# Patient Record
Sex: Female | Born: 1940 | Race: Black or African American | Hispanic: No | Marital: Married | Smoking: Never smoker
Health system: Southern US, Community
[De-identification: ages and names within clinical notes are randomized; demographics above are authoritative.]

## PROBLEM LIST (undated history)

## (undated) DIAGNOSIS — F419 Anxiety disorder, unspecified: Secondary | ICD-10-CM

## (undated) DIAGNOSIS — I809 Phlebitis and thrombophlebitis of unspecified site: Secondary | ICD-10-CM

## (undated) DIAGNOSIS — C801 Malignant (primary) neoplasm, unspecified: Secondary | ICD-10-CM

## (undated) DIAGNOSIS — F329 Major depressive disorder, single episode, unspecified: Secondary | ICD-10-CM

## (undated) DIAGNOSIS — R609 Edema, unspecified: Secondary | ICD-10-CM

## (undated) DIAGNOSIS — E039 Hypothyroidism, unspecified: Secondary | ICD-10-CM

## (undated) DIAGNOSIS — I10 Essential (primary) hypertension: Secondary | ICD-10-CM

## (undated) DIAGNOSIS — F32A Depression, unspecified: Secondary | ICD-10-CM

## (undated) HISTORY — PX: THYROIDECTOMY: SHX17

## (undated) HISTORY — PX: MASTECTOMY: SHX3

## (undated) HISTORY — PX: ABDOMINAL HYSTERECTOMY: SHX81

---

## 2007-03-21 ENCOUNTER — Ambulatory Visit: Payer: Self-pay | Admitting: Gastroenterology

## 2012-02-12 ENCOUNTER — Ambulatory Visit: Payer: Self-pay | Admitting: Oncology

## 2012-02-29 ENCOUNTER — Ambulatory Visit: Payer: Self-pay | Admitting: Oncology

## 2012-03-11 ENCOUNTER — Encounter: Payer: Self-pay | Admitting: Oncology

## 2012-03-31 ENCOUNTER — Encounter: Payer: Self-pay | Admitting: Oncology

## 2012-04-30 ENCOUNTER — Encounter: Payer: Self-pay | Admitting: Oncology

## 2012-05-31 ENCOUNTER — Encounter: Payer: Self-pay | Admitting: Oncology

## 2013-09-04 ENCOUNTER — Ambulatory Visit: Payer: Self-pay | Admitting: Gastroenterology

## 2013-09-24 IMAGING — US US EXTREM UP VENOUS*L*
1 series · 14 of 24 positions shown · non-contrast
Comparison: none

REASON FOR EXAM: swelling left arm
COMMENTS:

PROCEDURE:     US  - US DOPPLER UP EXTR LEFT  - February 19, 2012 [DATE]
RESULT:

[Series 1: us extrem up venous*left* · 0.08mm/px · 14 of 46 slices shown]
[im 1/46]
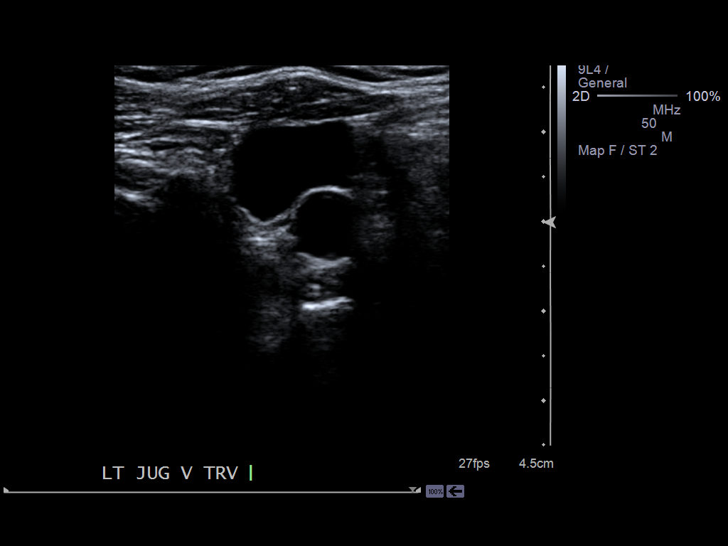
[im 4/46]
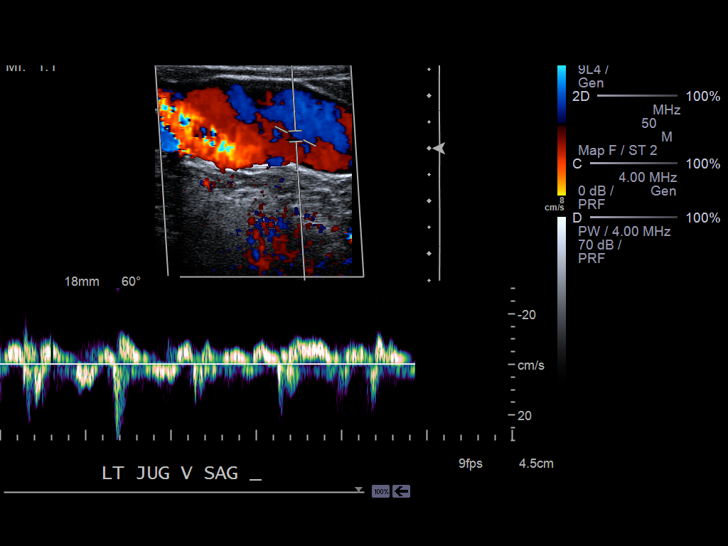
[im 8/46]
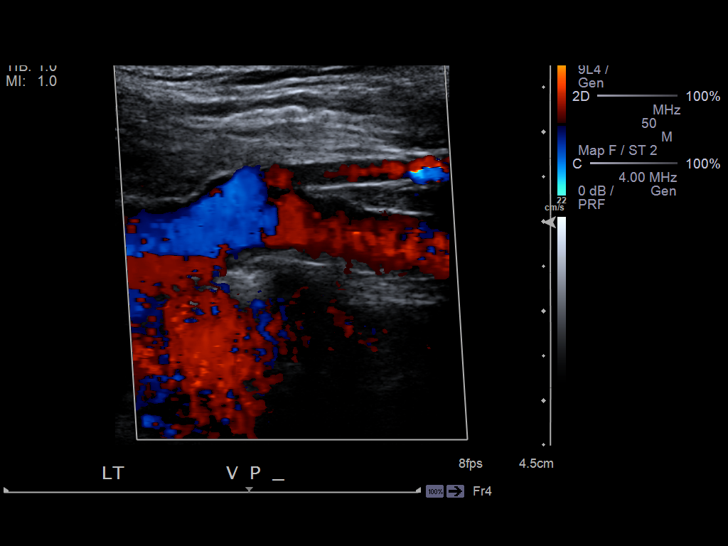
[im 12/46]
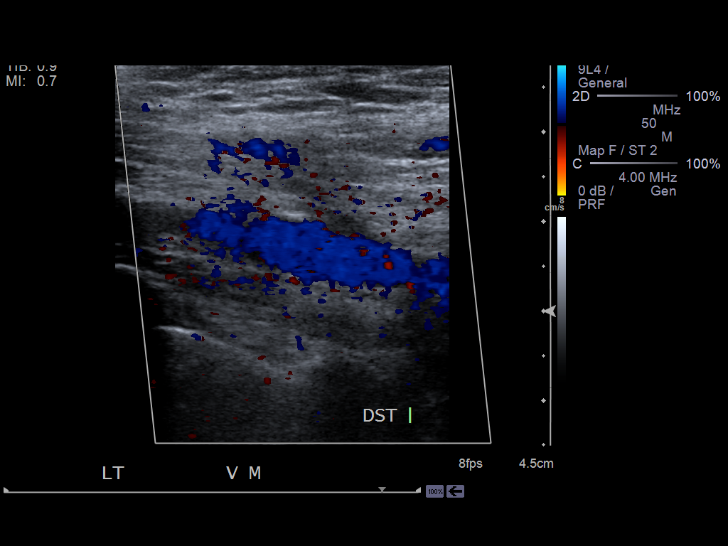
[im 14/46]
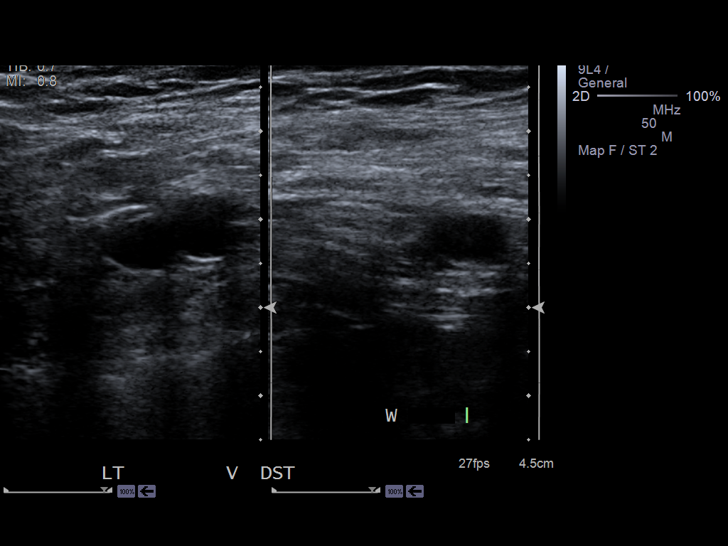
[im 18/46]
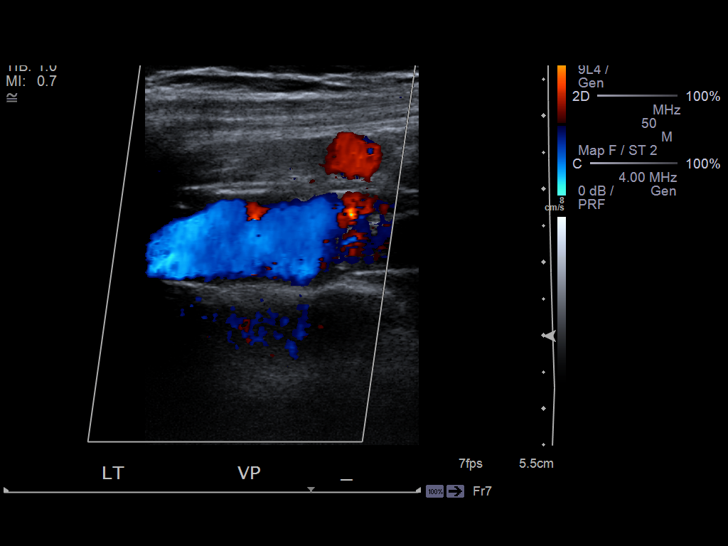
[im 22/46]
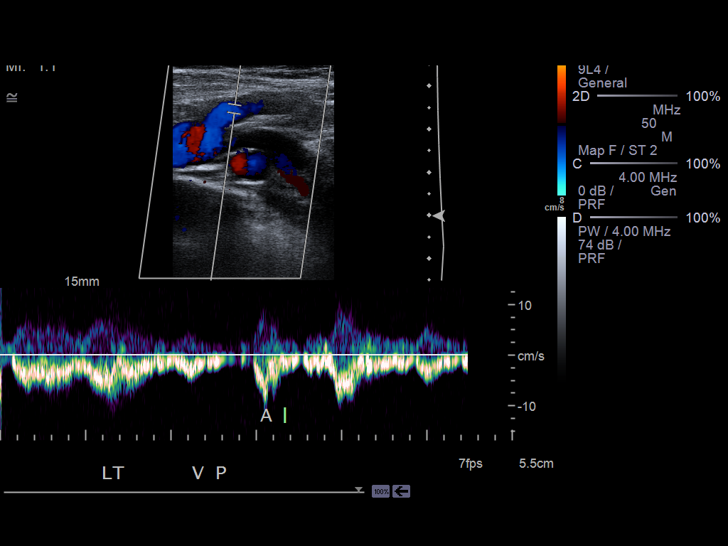
[im 24/46]
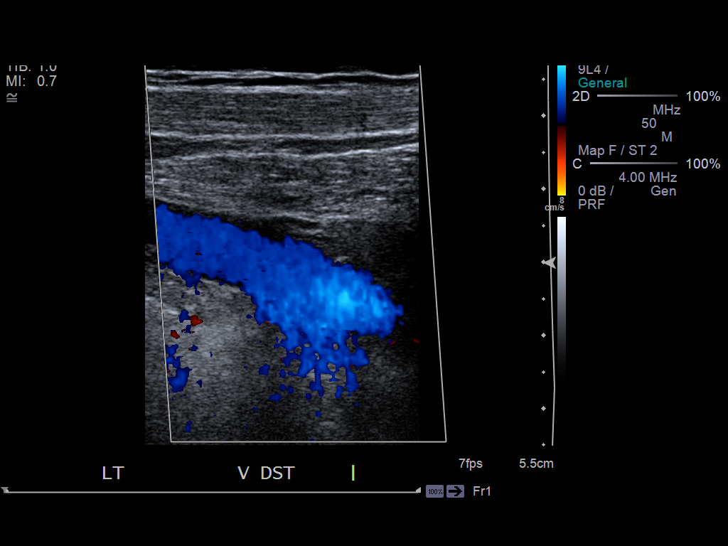
[im 28/46]
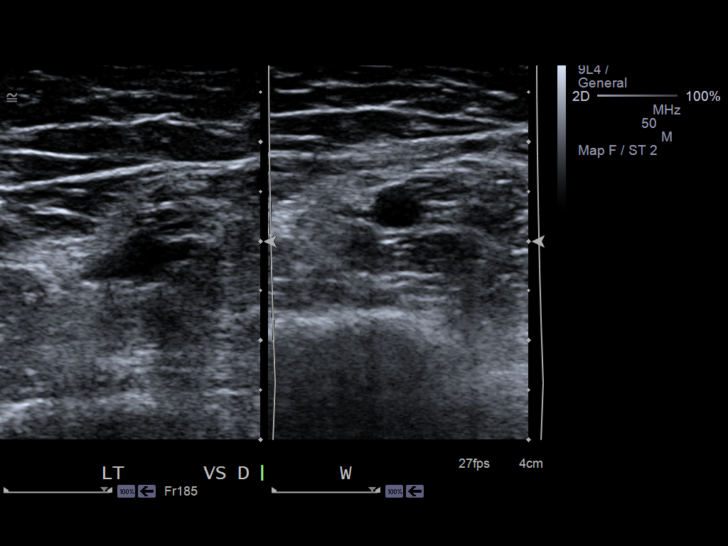
[im 32/46]
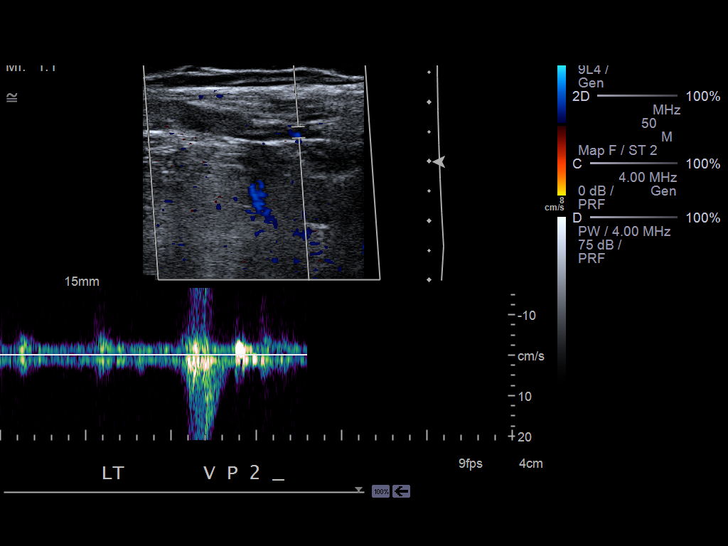
[im 36/46]
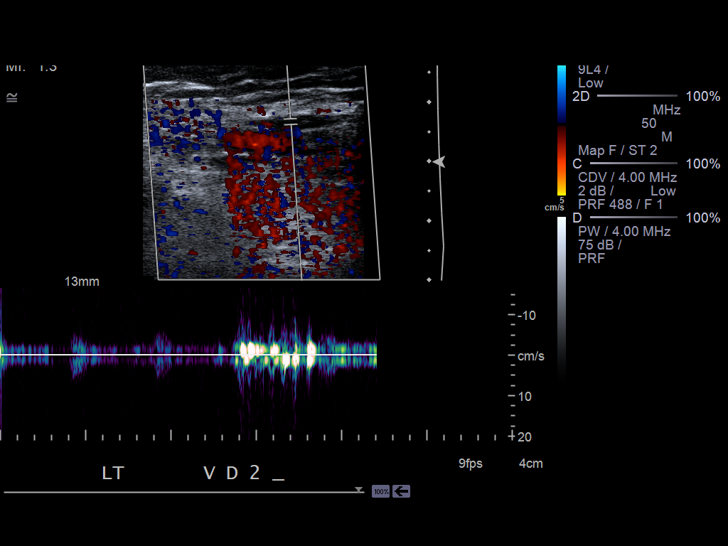
[im 38/46]
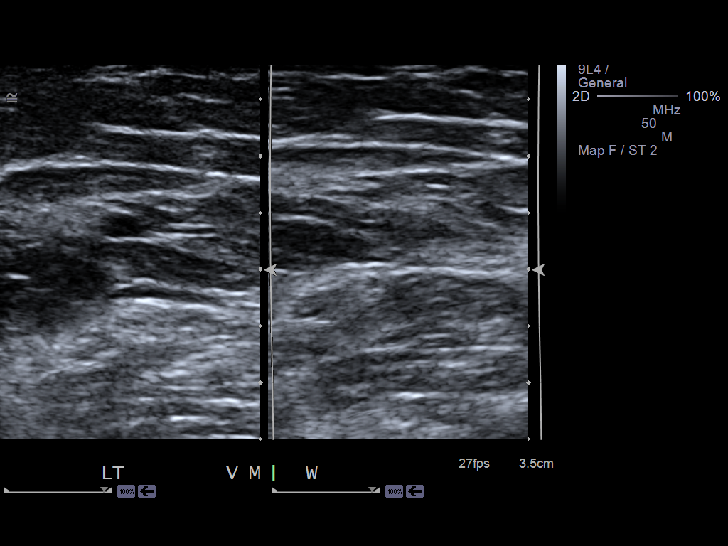
[im 42/46]
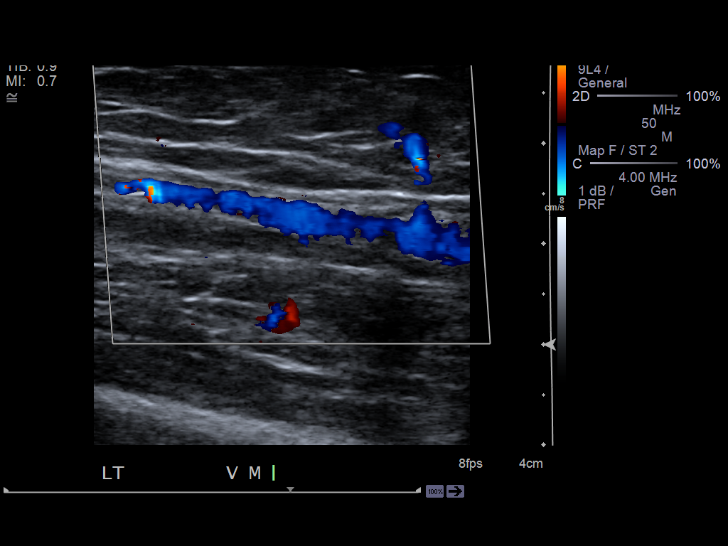
[im 46/46]
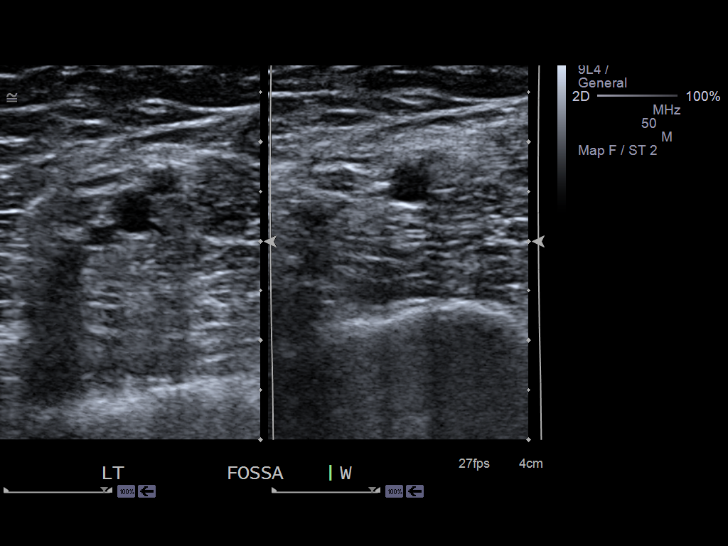

[14 of 24 positions shown; findings below may reference images not displayed]

FINDINGS: Grayscale, duplex Doppler, color flow and SPECTRAL waveform
imaging was performed of the deep venous structures of the left upper
extremity.

Interrogation of the venous structures demonstrates no evidence of abnormal
compressibility or regions of increased echogenicity. Appropriate color
filling is identified within the vessels as well as appropriate SPECTRAL
waveform imaging.
IMPRESSION: No sonographic evidence of deep venous thrombus in the interrogated
structures of the left upper extremity.

## 2014-11-17 NOTE — Consult Note (Signed)
Note Type Consult   HPI: Referred by Elin Claggett, PA-C.   This 74 year old Female patient presents to the clinic for initial evaluation of (1)Left arm swelling.  Subjective:  Chief Complaint/Diagnosis:   left arm swelling.  HPI:   Patient is a 74 year old female who had a left mastectomy in 1991 for breast cancer who recently had left arm swelling but was not improving.  She also has a history of a right mastectomy in the late 70s.  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  Currently, other than her left arm swelling she feels well.  She has no neurologic complaints.  She denies any fevers or illnesses.  She has good appetite and denies weight loss.  She denies any pain.  She has no chest pain or shortness of breath.  She denies any nausea, vomiting, constipation, or diarrhea.  She has no urinary complaints.  Patient feels at her baseline and offers no further specific complaints today.   Review of Systems:   Performance Status (ECOG): 0   Pain ?: No complaints (0, none)   Emotional well-being: None   Review of Systems:   As per HPI. Otherwise, 10 point system review was negative.   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    Family history: Hypertension, hypercholesteremia.    Social history: Patient denies tobacco or alcohol.   Home Medications: Medication Instructions Last Modified Date/Time  levothyroxine 75 mcg (0.075 mg) oral tablet 1 tab(s) orally once a day 22-Jul-13 10:49  sertraline 100 mg oral tablet 1 tab(s) orally 2 times a day 22-Jul-13 10:49  ergocalciferol 50,000 intl units (1.25 mg) oral capsule cap(s) orally  22-Jul-13 10:49  pravastatin 80 mg oral tablet 1 tab(s) orally once a day (at bedtime) 22-Jul-13 10:49  aspirin 81 mg oral  tablet 1 tab(s) orally once a day 22-Jul-13 10:49  hydrochlorothiazide-losartan 25 mg-100 mg oral tablet 1 tab(s) orally once a day 22-Jul-13 10:49  multivitamin 1  orally once a day 22-Jul-13 10:49   Vital Signs:   :: Ht(CM): 170 Wt(KG): 71.2 BSA: 1.8 Temp: 98.4 Pulse: 72 RR: 20  BP: 146/75   Physical Exam:   General: well developed, well nourished, and in no acute distress   Mental Status: normal affect   Eyes: anicteric sclera   Head, Ears, Nose,Throat: Normocephalic, moist mucous membranes, clear oropharynx without erythema or thrush.   Neck, Thyroid: No palpable lymphadenopathy, thyroid midline without nodules.   Respiratory: clear to auscultation bilaterally   Cardiovascular: regular rate and rhythm, no murmur, rub, or gallop   Gastrointestinal: soft, nondistended, nontender, no organomegaly.  normal active bowel sounds   Musculoskeletal: Mild edema of the left distal upper extremity.   Skin: No rash or petechiae noted   Neurological: alert, answering all questions appropriately.  Cranial nerves grossly intact   Assessment and Plan:  Impression:   Left upper extremity swelling  Plan:   1.  Left arm swelling:  Unclear etiology.  It would be unusual to develop lymphedema greater than 12 years removed from her surgery, but given patient's recent increase in activity it is possible.  Ultrasound Doppler today of her arm was negative for DVT or any other abnormality.  Patient was given a referral to lymphedema clinic for further evaluation and treatment.  No followup is necessary in  the [place].  Patient expressed understanding and was in agreement with this plan.  CC Referral:   cc: Astronomer, PA-C   Electronic Signatures: Delight Hoh (MD)  (Signed 22-Jul-13 12:56)  Authored: Note Type, History of Present Illness, CC/HPI, Review of Systems, ALLERGIES, Patient Family Social History, HOME MEDICATIONS, Vital Signs, Physical Exam, Assessment and Plan, CC Referring  Physician   Last Updated: 22-Jul-13 12:56 by Delight Hoh (MD)  References: 1.  Data Referenced From "[facility] Office Nurse Note" 19-Feb-2012 10:39 AM

## 2016-07-17 ENCOUNTER — Other Ambulatory Visit (HOSPITAL_COMMUNITY): Payer: Self-pay | Admitting: Nephrology

## 2016-07-17 DIAGNOSIS — N183 Chronic kidney disease, stage 3 unspecified: Secondary | ICD-10-CM

## 2016-07-20 ENCOUNTER — Ambulatory Visit (HOSPITAL_COMMUNITY): Payer: Self-pay

## 2016-07-27 ENCOUNTER — Ambulatory Visit (HOSPITAL_COMMUNITY)
Admission: RE | Admit: 2016-07-27 | Discharge: 2016-07-27 | Disposition: A | Discharge status: Home / Self Care | Payer: Medicare Other | Source: Ambulatory Visit | Attending: Nephrology | Admitting: Nephrology

## 2016-07-27 DIAGNOSIS — N183 Chronic kidney disease, stage 3 unspecified: Secondary | ICD-10-CM

## 2016-07-27 DIAGNOSIS — R9341 Abnormal radiologic findings on diagnostic imaging of renal pelvis, ureter, or bladder: Secondary | ICD-10-CM | POA: Diagnosis not present

## 2017-03-28 ENCOUNTER — Encounter: Payer: Self-pay | Admitting: *Deleted

## 2017-04-03 NOTE — H&P (Signed)
See scanned not.

## 2017-04-04 ENCOUNTER — Ambulatory Visit: Payer: Medicare Other | Admitting: Anesthesiology

## 2017-04-04 ENCOUNTER — Encounter: Payer: Self-pay | Admitting: Anesthesiology

## 2017-04-04 ENCOUNTER — Ambulatory Visit
Admission: RE | Admit: 2017-04-04 | Discharge: 2017-04-04 | Disposition: A | Discharge status: Home / Self Care | Payer: Medicare Other | Source: Ambulatory Visit | Attending: Ophthalmology | Admitting: Ophthalmology

## 2017-04-04 ENCOUNTER — Encounter
Admission: RE | Disposition: A | Discharge status: Home / Self Care | Payer: Self-pay | Source: Ambulatory Visit | Attending: Ophthalmology

## 2017-04-04 DIAGNOSIS — I10 Essential (primary) hypertension: Secondary | ICD-10-CM | POA: Diagnosis not present

## 2017-04-04 DIAGNOSIS — H2512 Age-related nuclear cataract, left eye: Secondary | ICD-10-CM | POA: Diagnosis present

## 2017-04-04 DIAGNOSIS — Z79899 Other long term (current) drug therapy: Secondary | ICD-10-CM | POA: Insufficient documentation

## 2017-04-04 DIAGNOSIS — E039 Hypothyroidism, unspecified: Secondary | ICD-10-CM | POA: Insufficient documentation

## 2017-04-04 HISTORY — DX: Major depressive disorder, single episode, unspecified: F32.9

## 2017-04-04 HISTORY — DX: Phlebitis and thrombophlebitis of unspecified site: I80.9

## 2017-04-04 HISTORY — DX: Malignant (primary) neoplasm, unspecified: C80.1

## 2017-04-04 HISTORY — DX: Depression, unspecified: F32.A

## 2017-04-04 HISTORY — DX: Edema, unspecified: R60.9

## 2017-04-04 HISTORY — DX: Anxiety disorder, unspecified: F41.9

## 2017-04-04 HISTORY — PX: CATARACT EXTRACTION W/PHACO: SHX586

## 2017-04-04 HISTORY — DX: Hypothyroidism, unspecified: E03.9

## 2017-04-04 HISTORY — DX: Essential (primary) hypertension: I10

## 2017-04-04 SURGERY — PHACOEMULSIFICATION, CATARACT, WITH IOL INSERTION
Anesthesia: Monitor Anesthesia Care | Site: Eye | Laterality: Left | Wound class: Clean

## 2017-04-04 MED ORDER — LIDOCAINE HCL (PF) 4 % IJ SOLN
INTRAMUSCULAR | Status: DC | PRN
  Administered 2017-04-04: 4 mL via OPHTHALMIC

## 2017-04-04 MED ORDER — CARBACHOL 0.01 % IO SOLN
INTRAOCULAR | Status: DC | PRN
  Administered 2017-04-04: .5 mL via INTRAOCULAR

## 2017-04-04 MED ORDER — NA CHONDROIT SULF-NA HYALURON 40-17 MG/ML IO SOLN
INTRAOCULAR | Status: DC | PRN
  Administered 2017-04-04: 1 mL via INTRAOCULAR

## 2017-04-04 MED ORDER — HYALURONIDASE HUMAN 150 UNIT/ML IJ SOLN
INTRAMUSCULAR | Status: AC
  Filled 2017-04-04: qty 1

## 2017-04-04 MED ORDER — PHENYLEPHRINE HCL 10 % OP SOLN
OPHTHALMIC | Status: AC
  Filled 2017-04-04: qty 5

## 2017-04-04 MED ORDER — MIDAZOLAM HCL 2 MG/2ML IJ SOLN
INTRAMUSCULAR | Status: DC | PRN
  Administered 2017-04-04: 1 mg via INTRAVENOUS

## 2017-04-04 MED ORDER — LIDOCAINE HCL (PF) 4 % IJ SOLN
INTRAOCULAR | Status: DC | PRN
  Administered 2017-04-04: 2 mL via OPHTHALMIC

## 2017-04-04 MED ORDER — CEFUROXIME OPHTHALMIC INJECTION 1 MG/0.1 ML
INJECTION | OPHTHALMIC | Status: DC | PRN
  Administered 2017-04-04: 1 mL via INTRACAMERAL

## 2017-04-04 MED ORDER — BUPIVACAINE HCL (PF) 0.75 % IJ SOLN
INTRAMUSCULAR | Status: AC
  Filled 2017-04-04: qty 10

## 2017-04-04 MED ORDER — POVIDONE-IODINE 5 % OP SOLN
OPHTHALMIC | Status: DC | PRN
  Administered 2017-04-04: 1 via OPHTHALMIC

## 2017-04-04 MED ORDER — ALFENTANIL 500 MCG/ML IJ INJ
INJECTION | INTRAVENOUS | Status: DC | PRN
  Administered 2017-04-04 (×2): 100 ug via INTRAVENOUS
  Administered 2017-04-04: 300 ug via INTRAVENOUS

## 2017-04-04 MED ORDER — POVIDONE-IODINE 5 % OP SOLN
OPHTHALMIC | Status: AC
  Filled 2017-04-04: qty 30

## 2017-04-04 MED ORDER — MOXIFLOXACIN HCL 0.5 % OP SOLN: 1.0000 [drp] | OPHTHALMIC | Status: DC

## 2017-04-04 MED ORDER — MIDAZOLAM HCL 2 MG/2ML IJ SOLN: INTRAMUSCULAR | Status: DC | PRN

## 2017-04-04 MED ORDER — EPINEPHRINE PF 1 MG/ML IJ SOLN
INTRAOCULAR | Status: DC | PRN
  Administered 2017-04-04: 1 mL via OPHTHALMIC

## 2017-04-04 MED ORDER — PROPOFOL 10 MG/ML IV BOLUS
INTRAVENOUS | Status: AC
  Filled 2017-04-04: qty 20

## 2017-04-04 MED ORDER — TETRACAINE HCL 0.5 % OP SOLN
OPHTHALMIC | Status: DC | PRN
  Administered 2017-04-04: 1 [drp] via OPHTHALMIC

## 2017-04-04 MED ORDER — CYCLOPENTOLATE HCL 2 % OP SOLN
OPHTHALMIC | Status: AC
  Filled 2017-04-04: qty 2

## 2017-04-04 MED ORDER — LIDOCAINE HCL (PF) 4 % IJ SOLN
INTRAMUSCULAR | Status: AC
  Filled 2017-04-04: qty 5

## 2017-04-04 MED ORDER — CYCLOPENTOLATE HCL 2 % OP SOLN
1.0000 [drp] | OPHTHALMIC | Status: AC
  Administered 2017-04-04 (×4): 1 [drp] via OPHTHALMIC

## 2017-04-04 MED ORDER — PHENYLEPHRINE HCL 10 % OP SOLN
1.0000 [drp] | OPHTHALMIC | Status: AC
  Administered 2017-04-04 (×4): 1 [drp] via OPHTHALMIC

## 2017-04-04 MED ORDER — MOXIFLOXACIN HCL 0.5 % OP SOLN
1.0000 [drp] | OPHTHALMIC | Status: AC | PRN
Start: 2017-04-04 — End: 2017-04-04
  Administered 2017-04-04 (×3): 1 [drp] via OPHTHALMIC

## 2017-04-04 MED ORDER — NA CHONDROIT SULF-NA HYALURON 40-17 MG/ML IO SOLN
INTRAOCULAR | Status: AC
  Filled 2017-04-04: qty 1

## 2017-04-04 MED ORDER — SODIUM CHLORIDE 0.9 % IV SOLN
INTRAVENOUS | Status: DC
  Administered 2017-04-04: 07:00:00 via INTRAVENOUS

## 2017-04-04 MED ORDER — MOXIFLOXACIN HCL 0.5 % OP SOLN
OPHTHALMIC | Status: DC | PRN
  Administered 2017-04-04: 1 [drp] via OPHTHALMIC

## 2017-04-04 MED ORDER — MOXIFLOXACIN HCL 0.5 % OP SOLN
OPHTHALMIC | Status: AC
  Administered 2017-04-04: 07:00:00
  Filled 2017-04-04: qty 3

## 2017-04-04 MED ORDER — CEFUROXIME OPHTHALMIC INJECTION 1 MG/0.1 ML
INJECTION | OPHTHALMIC | Status: AC
  Filled 2017-04-04: qty 0.1

## 2017-04-04 MED ORDER — MIDAZOLAM HCL 2 MG/2ML IJ SOLN
INTRAMUSCULAR | Status: AC
  Filled 2017-04-04: qty 2

## 2017-04-04 MED ORDER — EPINEPHRINE PF 1 MG/ML IJ SOLN
INTRAMUSCULAR | Status: AC
  Filled 2017-04-04: qty 1

## 2017-04-04 SURGICAL SUPPLY — 24 items
CORD BIP STRL DISP 12FT (MISCELLANEOUS) ×3 IMPLANT
DRAPE XRAY CASSETTE 23X24 (DRAPES) ×3 IMPLANT
ERASER HMR WETFIELD 18G (MISCELLANEOUS) ×3 IMPLANT
GLOVE BIO SURGEON STRL SZ8 (GLOVE) ×3 IMPLANT
GLOVE SURG LX 6.5 MICRO (GLOVE) ×2
GLOVE SURG LX 8.0 MICRO (GLOVE) ×2
GLOVE SURG LX STRL 6.5 MICRO (GLOVE) ×1 IMPLANT
GLOVE SURG LX STRL 8.0 MICRO (GLOVE) ×1 IMPLANT
GOWN STRL REUS W/ TWL LRG LVL3 (GOWN DISPOSABLE) ×1 IMPLANT
GOWN STRL REUS W/ TWL XL LVL3 (GOWN DISPOSABLE) ×1 IMPLANT
GOWN STRL REUS W/TWL LRG LVL3 (GOWN DISPOSABLE) ×2
GOWN STRL REUS W/TWL XL LVL3 (GOWN DISPOSABLE) ×2
LABEL CATARACT MEDS ST (LABEL) ×3 IMPLANT
LENS IOL ACRYSOF IQ 20.5 (Intraocular Lens) ×3 IMPLANT
PACK CATARACT (MISCELLANEOUS) ×3 IMPLANT
PACK CATARACT DINGLEDEIN LX (MISCELLANEOUS) ×3 IMPLANT
PACK EYE AFTER SURG (MISCELLANEOUS) ×3 IMPLANT
SHLD EYE VISITEC  UNIV (MISCELLANEOUS) ×3 IMPLANT
SOL BSS BAG (MISCELLANEOUS) ×3
SOLUTION BSS BAG (MISCELLANEOUS) ×1 IMPLANT
SUT SILK 5-0 (SUTURE) ×3 IMPLANT
SYR 5ML LL (SYRINGE) ×3 IMPLANT
WATER STERILE IRR 250ML POUR (IV SOLUTION) ×3 IMPLANT
WIPE NON LINTING 3.25X3.25 (MISCELLANEOUS) ×3 IMPLANT

## 2017-04-04 NOTE — Anesthesia Preprocedure Evaluation (Signed)
Anesthesia Evaluation  Patient identified by MRN, date of birth, ID band Patient awake    Reviewed: Allergy & Precautions, NPO status , Patient's Chart, lab work & pertinent test results, reviewed documented beta blocker date and time   Airway Mallampati: II  TM Distance: >3 FB     Dental  (+) Chipped   Pulmonary           Cardiovascular hypertension, Pt. on medications      Neuro/Psych PSYCHIATRIC DISORDERS Anxiety Depression    GI/Hepatic   Endo/Other  Hypothyroidism   Renal/GU      Musculoskeletal   Abdominal   Peds  Hematology   Anesthesia Other Findings   Reproductive/Obstetrics                             Anesthesia Physical Anesthesia Plan  ASA: III  Anesthesia Plan: MAC   Post-op Pain Management:    Induction:   PONV Risk Score and Plan:   Airway Management Planned:   Additional Equipment:   Intra-op Plan:   Post-operative Plan:   Informed Consent: I have reviewed the patients History and Physical, chart, labs and discussed the procedure including the risks, benefits and alternatives for the proposed anesthesia with the patient or authorized representative who has indicated his/her understanding and acceptance.     Plan Discussed with: CRNA  Anesthesia Plan Comments:         Anesthesia Quick Evaluation

## 2017-04-04 NOTE — Anesthesia Postprocedure Evaluation (Signed)
Anesthesia Post Note  Patient: Brianna Keller  Procedure(s) Performed: Procedure(s) (LRB): CATARACT EXTRACTION PHACO AND INTRAOCULAR LENS PLACEMENT ([facility]) (Left)  Patient location during evaluation: PACU Anesthesia Type: MAC Level of consciousness: awake and alert Pain management: pain level controlled Vital Signs Assessment: post-procedure vital signs reviewed and stable Respiratory status: spontaneous breathing, nonlabored ventilation, respiratory function stable and patient connected to nasal cannula oxygen Cardiovascular status: stable and blood pressure returned to baseline Anesthetic complications: no     Last Vitals:  Vitals:   04/04/17 0825 04/04/17 0840  BP: (!) 158/85 (!) 176/67  Pulse: (!) 57 (!) 58  Resp: 16   Temp: (!) 36 C   SpO2: 100% 96%    Last Pain:  Vitals:   04/04/17 0825  TempSrc: Temporal                 Amarisa Wilinski S

## 2017-04-04 NOTE — Anesthesia Post-op Follow-up Note (Signed)
Anesthesia QCDR form completed.        

## 2017-04-04 NOTE — Transfer of Care (Signed)
Immediate Anesthesia Transfer of Care Note  Patient: Brianna Keller  Procedure(s) Performed: Procedure(s) with comments: CATARACT EXTRACTION PHACO AND INTRAOCULAR LENS PLACEMENT ([place]) (Left) - [place] 02:16 AP% 26.5 CDE 55.43 Fluid pack lot # [id number]  Patient Location: PACU  Anesthesia Type:MAC  Level of Consciousness: awake, alert  and oriented  Airway & Oxygen Therapy: Patient Spontanous Breathing  Post-op Assessment: Report given to RN and Post -op Vital signs reviewed and stable  Post vital signs: Reviewed and stable  Last Vitals:  Vitals:   04/04/17 0633  BP: (!) 197/58  Pulse: 65  Resp: 18  Temp: 36.5 C  SpO2: 100%    Last Pain:  Vitals:   04/04/17 0633  TempSrc: Tympanic         Complications: No apparent anesthesia complications

## 2017-04-04 NOTE — Discharge Instructions (Addendum)
Eye Surgery Discharge Instructions  Expect mild scratchy sensation or mild soreness. DO NOT RUB YOUR EYE!  The day of surgery:  Minimal physical activity, but bed rest is not required  No reading, computer work, or close hand work  No bending, lifting, or straining.  May watch TV  For 24 hours:  No driving, legal decisions, or alcoholic beverages  Safety precautions  Eat anything you prefer: It is better to start with liquids, then soup then solid foods.  _____ Eye patch should be worn until postoperative exam tomorrow.  ____ Solar shield eyeglasses should be worn for comfort in the sunlight/patch while sleeping  Resume all regular medications including aspirin or Coumadin if these were discontinued prior to surgery. You may shower, bathe, shave, or wash your hair. Tylenol may be taken for mild discomfort.  Call your doctor if you experience significant pain, nausea, or vomiting, fever > 101 or other signs of infection. [phone] or [phone] Specific instructions:  Follow-up Information    Reis Goga, Remo Lipps, MD Follow up.   Specialty:  Ophthalmology Why:  04/05/17 @ 9:15 am Contact information: [address]   [place] [address] [phone]

## 2017-04-04 NOTE — Op Note (Signed)
Date of Surgery: 04/04/2017 Date of Dictation: 04/04/2017 8:24 AM Pre-operative Diagnosis:  Nuclear Sclerotic Cataract left Eye Post-operative Diagnosis: same Procedure performed: Extra-capsular Cataract Extraction (ECCE) with placement of a posterior chamber intraocular lens (IOL) left Eye IOL:  Implant Name Type Inv. Item Serial No. Manufacturer Lot No. LRB No. Used  LENS IOL ACRYSOF IQ 20.5 - [id number] Intraocular Lens LENS IOL ACRYSOF IQ 20.5 [id number] ALCON  Left 1  LENS IOL ACRYSOF IQ 20.5 - [id number] Intraocular Lens LENS IOL ACRYSOF IQ 20.5 [id number] ALCON   Left 1   Anesthesia: 2% Lidocaine and 4% Marcaine in a 50/50 mixture with 10 unites/ml of Hylenex given as a peribulbar Anesthesiologist: Anesthesiologist: Gunnar Bulla, MD CRNA: Demetrius Charity, CRNA Complications: none Estimated Blood Loss: less than 1 ml  Description of procedure:  The patient was given anesthesia and sedation via intravenous access. The patient was then prepped and draped in the usual fashion. A 25-gauge needle was bent for initiating the capsulorhexis. A 5-0 silk suture was placed through the conjunctiva superior and inferiorly to serve as bridle sutures. Hemostasis was obtained at the superior limbus using an eraser cautery. A partial thickness groove was made at the anterior surgical limbus with a 64 Beaver blade and this was dissected anteriorly with an [employer]. The anterior chamber was entered at 10 o'clock with a 1.0 mm paracentesis knife and through the lamellar dissection with a 2.6 mm Alcon keratome. Epi-Shugarcaine 0.5 CC [9 cc BSS Plus (Alcon), 3 cc 4% preservative-free lidocaine (Hospira) and 4 cc 1:1000 preservative-free, bisulfite-free epinephrine] was injected into the anterior chamber via the paracentesis tract. Epi-Shugarcaine 0.5 CC [9 cc BSS Plus (Alcon), 3 cc 4% preservative-free lidocaine (Hospira) and 4 cc 1:1000 preservative-free, bisulfite-free epinephrine] was  injected into the anterior chamber via the paracentesis tract. DiscoVisc was injected to replace the aqueous and a continuous tear curvilinear capsulorhexis was performed using a bent 25-gauge needle.  Balance salt on a syringe was used to perform hydro-dissection and phacoemulsification was carried out using a divide and conquer technique. Procedure(s) with comments: CATARACT EXTRACTION PHACO AND INTRAOCULAR LENS PLACEMENT (I[facility]) (Left) - [place] 02:16 AP% 26.5 CDE 55.43 Fluid pack lot # [id number]. Irrigation/aspiration was used to remove the residual cortex and the capsular bag was inflated with DiscoVisc. The intraocular lens was inserted into the capsular bag using a pre-loaded UltraSert Delivery System. Irrigation/aspiration was used to remove the residual DiscoVisc. The wound was inflated with balanced salt and checked for leaks. None were found. Miostat was injected via the paracentesis track and 0.1 ml of cefuroxime containing 1 mg of drug  was injected via the paracentesis track. The wound was checked for leaks again and none were found.   The bridal sutures were removed and two drops of Vigamox were placed on the eye. An eye shield was placed to protect the eye and the patient was discharged to the recovery area in good condition.   Kazi Reppond MD

## 2017-04-04 NOTE — Interval H&P Note (Signed)
History and Physical Interval Note:  08/03/6429 4:27 AM  Brianna Keller  has presented today for surgery, with the diagnosis of nuclear sclerotic cataract left eye  The various methods of treatment have been discussed with the patient and family. After consideration of risks, benefits and other options for treatment, the patient has consented to  Procedure(s) with comments: CATARACT EXTRACTION PHACO AND INTRAOCULAR LENS PLACEMENT ([facility]) (Left) - [place] AP% CDE Fluid pack lot # [id number] as a surgical intervention .  The patient's history has been reviewed, patient examined, no change in status, stable for surgery.  I have reviewed the patient's chart and labs.  Questions were answered to the patient's satisfaction.     Jeanmarc Viernes

## 2017-04-04 NOTE — OR Nursing (Signed)
Pt had about 1/4 glass of wine last night around 2030

## 2017-04-04 NOTE — Anesthesia Procedure Notes (Signed)
Procedure Name: MAC Performed by: Demetrius Charity Pre-anesthesia Checklist: Patient identified, Emergency Drugs available, Suction available, Patient being monitored and Timeout performed Oxygen Delivery Method: Nasal cannula

## 2017-09-12 ENCOUNTER — Encounter (INDEPENDENT_AMBULATORY_CARE_PROVIDER_SITE_OTHER): Payer: Self-pay | Admitting: Ophthalmology

## 2017-09-13 ENCOUNTER — Encounter (INDEPENDENT_AMBULATORY_CARE_PROVIDER_SITE_OTHER): Payer: Medicare Other | Admitting: Ophthalmology

## 2017-09-13 DIAGNOSIS — H59022 Cataract (lens) fragments in eye following cataract surgery, left eye: Secondary | ICD-10-CM | POA: Diagnosis not present

## 2017-09-13 DIAGNOSIS — I10 Essential (primary) hypertension: Secondary | ICD-10-CM

## 2017-09-13 DIAGNOSIS — H43813 Vitreous degeneration, bilateral: Secondary | ICD-10-CM | POA: Diagnosis not present

## 2017-09-13 DIAGNOSIS — H35033 Hypertensive retinopathy, bilateral: Secondary | ICD-10-CM | POA: Diagnosis not present

## 2017-09-13 DIAGNOSIS — H2511 Age-related nuclear cataract, right eye: Secondary | ICD-10-CM

## 2017-10-17 ENCOUNTER — Encounter (INDEPENDENT_AMBULATORY_CARE_PROVIDER_SITE_OTHER): Payer: Medicare Other | Admitting: Ophthalmology

## 2017-10-25 ENCOUNTER — Encounter (INDEPENDENT_AMBULATORY_CARE_PROVIDER_SITE_OTHER): Payer: Medicare Other | Admitting: Ophthalmology

## 2017-11-02 ENCOUNTER — Encounter (INDEPENDENT_AMBULATORY_CARE_PROVIDER_SITE_OTHER): Payer: Medicare Other | Admitting: Ophthalmology

## 2017-11-02 ENCOUNTER — Ambulatory Visit (INDEPENDENT_AMBULATORY_CARE_PROVIDER_SITE_OTHER): Payer: Medicare Other | Admitting: Ophthalmology

## 2017-11-02 ENCOUNTER — Encounter (INDEPENDENT_AMBULATORY_CARE_PROVIDER_SITE_OTHER): Payer: Self-pay | Admitting: Ophthalmology

## 2017-11-02 DIAGNOSIS — H209 Unspecified iridocyclitis: Secondary | ICD-10-CM

## 2017-11-02 DIAGNOSIS — Z961 Presence of intraocular lens: Secondary | ICD-10-CM | POA: Diagnosis not present

## 2017-11-02 DIAGNOSIS — H3581 Retinal edema: Secondary | ICD-10-CM | POA: Diagnosis not present

## 2017-11-02 DIAGNOSIS — H25813 Combined forms of age-related cataract, bilateral: Secondary | ICD-10-CM

## 2017-11-02 MED ORDER — PREDNISOLONE ACETATE 1 % OP SUSP: 1.0000 [drp] | OPHTHALMIC | 0 refills | Status: AC

## 2017-11-02 NOTE — Progress Notes (Signed)
[facility] Note  11/02/2017     CHIEF COMPLAINT Patient presents for Eye Pain and Eye Problem   HISTORY OF PRESENT ILLNESS: Brianna Keller is a 77 y.o. female who presents to the clinic today for:   HPI    Eye Pain      In left eye.  Characterized as pain with eye movement.  Pain was noted as 4/10.  Occurring intermittently.  It is worse at random times.  Duration of 1 day.  Since onset it is gradually improving.  Associated symptoms include tearing, headaches, sinusitis, redness, foreign body sensation, discharge, photophobia, swelling and nausea.  Negative for double vision, blurred vision, itching, recent URI, postnasal drip and migraines.  Treatments tried include eye drops and artificial tears.  Response to treatment was mild improvement.          Comments    Patient woke up yesterday am with sudden onset of eye pain OS. OS was sensitive to light, positive for eye pain especially with eye movement. Patient has history of several episodes of similar symptoms since CE with IOL OS on 04-04-2017 with Dr. Jeni Salles. Had headache yesterday with some nausea. Took tylenol which helped with headache and nausea. Vision blurry OS yesterday. Still blurred today with some improvement. Pain/light sensitivity not as severe today.Taking Pred Forte qid OS for CME per Dr. Zigmund Daniel.        Last edited by Roselee Nova D on 11/02/2017  8:38 AM. (History)    Pt states she had OS cataract sx with Dr. Jeni Salles; Pt states she felt it was not healing "the way it should"; pt states she went for a second opinion to Dr. Herbert Deaner; pt states Dr. Herbert Deaner sent her to Dr. Zigmund Daniel; Pt states she has been using PF since having cataract sx; pt states she "started off' on Durezol; Pt states she feels PF is helping more than the Durezol did; Pt states she woke up yesterday AM with eye pain; Pt states she continued to use PF QID, systane, taking oral tylenol, and using warm compresses; Pt states  she "dropped" the PF to BID x 3 weeks ago, pt believes she may have had a "flare up" yesterday due to dropping PF down; pt denies hx of any inflammatory diseases, denies hx of any back pain;   Referring physician: Hayden Pedro, MD [place], [place] [address]  HISTORICAL INFORMATION:   Selected notes from the [facility] Referred by Dr. Zigmund Daniel for concern of uveitis;  LEE- 02.14.19 (JDM) [BCVA OD: 20/25 OS: 20/25+2] Ocular Hx- HTN ret OU, cataract OD, pseudophakia OS ([place]), iritis OD, S/P kenalog inj OS (0.51m), on PF QID OS, FA (02.1.18) with late mild CME OS and geographic CME; PMH-     CURRENT MEDICATIONS: Current Outpatient Medications (Ophthalmic Drugs)  Medication Sig  . prednisoLONE acetate (PRED FORTE) 1 % ophthalmic suspension Place 1 drop into the left eye every 2 (two) hours.   No current facility-administered medications for this visit.  (Ophthalmic Drugs)   Current Outpatient Medications (Other)  Medication Sig  . amLODipine (NORVASC) 10 MG tablet Take 10 mg by mouth daily.  .Marland Kitchenaspirin 81 MG chewable tablet Chew 81 mg by mouth daily.  . cholecalciferol (VITAMIN D) 1000 units tablet Take 1,000 Units by mouth daily.  .Marland Kitchenlevothyroxine (SYNTHROID, LEVOTHROID) 50 MCG tablet Take 50 mcg by mouth daily before breakfast.  . losartan-hydrochlorothiazide (HYZAAR) 100-25 MG tablet Take 1 tablet by mouth  daily.  . pravastatin (PRAVACHOL) 80 MG tablet Take 80 mg by mouth daily.   No current facility-administered medications for this visit.  (Other)      REVIEW OF SYSTEMS: ROS    Positive for: Endocrine, Cardiovascular, Eyes   Negative for: Constitutional, Gastrointestinal, Neurological, Skin, Genitourinary, Musculoskeletal, HENT, Respiratory, Psychiatric, Allergic/Imm, Heme/Lymph   Last edited by Roselee Nova D on 11/02/2017  8:11 AM. (History)       ALLERGIES Allergies  Allergen Reactions  . Ace Inhibitors   . Valium [Diazepam]      PAST MEDICAL HISTORY Past Medical History:  Diagnosis Date  . Anxiety   . Cancer ([place])    breast  . Depression   . Edema    ANKLES  . Hypertension   . Hypothyroidism   . Phlebitis    H/O RIGHT ARM   Past Surgical History:  Procedure Laterality Date  . ABDOMINAL HYSTERECTOMY    . CATARACT EXTRACTION W/PHACO Left 04/04/2017   Procedure: CATARACT EXTRACTION PHACO AND INTRAOCULAR LENS PLACEMENT (IOC);  Surgeon: Estill Cotta, MD;  Location: [facility] ORS;  Service: Ophthalmology;  Laterality: Left;  [place] 02:16 AP% 26.5 CDE 55.43 Fluid pack lot # [id number]  . MASTECTOMY     BIL  . THYROIDECTOMY      FAMILY HISTORY Family History  Problem Relation Age of Onset  . Cancer Sister     SOCIAL HISTORY Social History   Tobacco Use  . Smoking status: Never Smoker  . Smokeless tobacco: Never Used  Substance Use Topics  . Alcohol use: No  . Drug use: No         OPHTHALMIC EXAM:  Base Eye Exam    Visual Acuity (Snellen - Linear)      Right Left   Dist cc 20/20 20/25   Dist ph cc  NI   Correction:  Glasses       Tonometry (Tonopen, 8:32 AM)      Right Left   Pressure 13 12       Pupils      Dark Light Shape React APD   Right 3 2 Round Slow None   Left 4 3.5 Round Slow Trace  ?trace APD OS-very subtle. Red desaturation OS with red cap test.        Visual Fields (Counting fingers)      Left Right    Full Full       Extraocular Movement      Right Left    Full, Ortho Full, Ortho       Neuro/Psych    Oriented x3:  Yes   Mood/Affect:  Normal       Dilation    Both eyes:  1.0% Mydriacyl, 2.5% Phenylephrine @ 8:35 AM        Slit Lamp and Fundus Exam    Slit Lamp Exam      Right Left   Lids/Lashes Dermatochalasis - upper lid Dermatochalasis - upper lid   Conjunctiva/Sclera Melanosis Melanosis, no injection   Cornea Arcus Arcus, trace endo pigment   Anterior Chamber Deep and quiet, no cell or flare 1-2+ Cell/pigment, deep   Iris Round and dilated  Round and dilated   Lens 2-3+ Nuclear sclerosis, 1+ Cortical cataract PC IOL in good position, anterior capsule fimosis, trace PCO   Vitreous Vitreous syneresis +pigmnet, Vitreous syneresis, vitreous condensation nasally       Fundus Exam      Right Left   Disc mild cupping, good rim pink and  sharp   C/D Ratio 0.65 0.55   Macula Blunted foveal reflex, mild Retinal pigment epithelial mottling, No heme or edema Good foveal reflex, mild Retinal pigment epithelial mottling, No heme or edema   Vessels Normal Normal   Periphery Attached, scattered peripheral drusen Attached, scattered drusen        Refraction    Wearing Rx      Sphere Cylinder Axis Add   Right -0.25 +1.25 048 +2.50   Left -0.50 +0.75 173 +2.50   Age:  49m  Type:  PAL       Manifest Refraction      Dist VA   Right 20/20   Left 20/20-2          IMAGING AND PROCEDURES  Imaging and Procedures for 11/02/17  OCT, Retina - OU - Both Eyes       Right Eye Quality was good. Central Foveal Thickness: 250. Progression has been stable. Findings include normal foveal contour, no IRF, no SRF.   Left Eye Quality was good. Central Foveal Thickness: 270. Progression has been stable. Findings include normal foveal contour, no IRF, no SRF (Trace punctate hyper-reflectivies in vitreous, trace punctate cystic changes).   Notes *Images captured and stored on drive  Diagnosis / Impression:  NFP, No IRF/SRF OU  Clinical management:  See below  Abbreviations: NFP - Normal foveal profile. CME - cystoid macular edema. PED - pigment epithelial detachment. IRF - intraretinal fluid. SRF - subretinal fluid. EZ - ellipsoid zone. ERM - epiretinal membrane. ORA - outer retinal atrophy. ORT - outer retinal tubulation. 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 Retinal edema H35.81 OCT, Retina - OU - Both  Eyes  4. Pseudophakia, left eye Z96.1   5. 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 Anterior Uveitis OS- iridocyclitis OS - likely post-op inflammatory flare up  - s/p Kenalog injection (0.8 ml) by Dr. MZigmund Danielon 02.14.19 - was on PF QID OS, but self tapered to BID 3 wks ago - developed acute pain, redness and light sensitivity yesterday -- improved with increasing PF back to QID - today, redness improved but still with 1+ AC and ant vitreous cell and pigment - start PF OS Q2hr while awake - F/U 10 days -- if not improved significantly will consider further work up - ROS negative for inflammatory disease, autoimmune disease, arthritis, low back pain  3. No retinal edema on exam or OCT  4. Pseudophakia OS  - s/p CE/IOL OS  - post op inflammatory flare up as above  5. Combined form age-related cataract OD-  - The symptoms of cataract, surgical options, and treatments and risks were discussed with patient. - discussed diagnosis and progression - not yet visually significant - monitor for now     Ophthalmic Meds Ordered this visit:  Meds ordered this encounter  Medications  . prednisoLONE acetate (PRED FORTE) 1 % ophthalmic suspension    Sig: Place 1 drop into the left eye every 2 (two) hours.    Dispense:  15 mL    Refill:  0       Return in about 10 days (around 11/12/2017) for F/U uveitis OS.  There are no Patient Instructions on file for this visit.   Explained  the diagnoses, plan, and follow up with the patient and they expressed understanding.  Patient expressed understanding of the importance of proper follow up care.   This document serves as a record of services personally performed by Gardiner Sleeper, MD, PhD. It was created on their behalf by Catha Brow, [place], a certified ophthalmic assistant. The creation of this record is the provider's dictation and/or activities during the visit.  Electronically signed by: Catha Brow, [place]   11/02/17 11:53 AM    Gardiner Sleeper, M.D., Ph.D. Diseases & Surgery of the Retina and [place] 11/02/17   I have reviewed the above documentation for accuracy and completeness, and I agree with the above. Gardiner Sleeper, M.D., Ph.D. 11/02/17 11:53 AM    Abbreviations: M myopia (nearsighted); A astigmatism; H hyperopia (farsighted); P presbyopia; Mrx spectacle prescription;  CTL contact lenses; OD right eye; OS left eye; OU both eyes  XT exotropia; ET esotropia; PEK punctate epithelial keratitis; PEE punctate epithelial erosions; DES dry eye syndrome; MGD meibomian gland dysfunction; ATs artificial tears; PFAT's preservative free artificial tears; [place] nuclear sclerotic cataract; PSC posterior subcapsular cataract; ERM epi-retinal membrane; PVD posterior vitreous detachment; RD retinal detachment; DM diabetes mellitus; DR diabetic retinopathy; NPDR non-proliferative diabetic retinopathy; PDR proliferative diabetic retinopathy; CSME clinically significant macular edema; DME diabetic macular edema; dbh dot blot hemorrhages; CWS cotton wool spot; POAG primary open angle glaucoma; C/D cup-to-disc ratio; HVF humphrey visual field; GVF goldmann visual field; OCT optical coherence tomography; IOP intraocular pressure; BRVO Branch retinal vein occlusion; CRVO central retinal vein occlusion; CRAO central retinal artery occlusion; BRAO branch retinal artery occlusion; RT retinal tear; SB scleral buckle; PPV pars plana vitrectomy; VH Vitreous hemorrhage; PRP panretinal laser photocoagulation; IVK intravitreal kenalog; VMT vitreomacular traction; MH Macular hole;  NVD neovascularization of the disc; NVE neovascularization elsewhere; AREDS age related eye disease study; ARMD age related macular degeneration; POAG primary open angle glaucoma; EBMD epithelial/anterior basement membrane dystrophy; ACIOL anterior chamber intraocular lens; IOL intraocular lens; PCIOL posterior chamber  intraocular lens; Phaco/IOL phacoemulsification with intraocular lens placement; [place] photorefractive keratectomy; LASIK laser assisted in situ keratomileusis; HTN hypertension; DM diabetes mellitus; COPD chronic obstructive pulmonary disease

## 2017-11-13 NOTE — Progress Notes (Signed)
[facility] Note  11/14/2017     CHIEF COMPLAINT Patient presents for Retina Follow Up   HISTORY OF PRESENT ILLNESS: Brianna Keller is a 77 y.o. female who presents to the clinic today for:   HPI    Retina Follow Up    Patient presents with  Other.  In left eye.  Severity is mild.  Since onset it is gradually improving.  I, the attending physician,  performed the HPI with the patient and updated documentation appropriately.          Comments    F/U Uveitis Os. Patient states she has "occasional achy feeling os but, the eye pain does not wake her up at night anymore".  Pt reports when she uses her Pred Forte she notices white curd in the corner of her OS.        Last edited by Bernarda Caffey, MD on 11/14/2017  1:46 PM. (History)    Pt states she has been using gtts as directed; Pt states she feels OS is still "throbbing but itsn't waking me up";   Referring physician: [facility] [place],  [id number]  HISTORICAL INFORMATION:   Selected notes from the MEDICAL RECORD NUMBER Referred by Dr. Zigmund Daniel for concern of uveitis;  LEE- 02.14.19 (JDM) [BCVA OD: 20/25 OS: 20/25+2] Ocular Hx- HTN ret OU, cataract OD, pseudophakia OS ([place]), iritis OD, S/P kenalog inj OS (0.64m), on PF QID OS, FA (02.1.18) with late mild CME OS and geographic CME; PMH-     CURRENT MEDICATIONS: Current Outpatient Medications (Ophthalmic Drugs)  Medication Sig  . prednisoLONE acetate (PRED FORTE) 1 % ophthalmic suspension Place 1 drop into the left eye every 2 (two) hours.   No current facility-administered medications for this visit.  (Ophthalmic Drugs)   Current Outpatient Medications (Other)  Medication Sig  . amLODipine (NORVASC) 10 MG tablet Take 10 mg by mouth daily.  .Marland Kitchenaspirin 81 MG chewable tablet Chew 81 mg by mouth daily.  . cholecalciferol (VITAMIN D) 1000 units tablet Take 1,000 Units by mouth daily.  .Marland Kitchen levothyroxine (SYNTHROID, LEVOTHROID) 50 MCG tablet Take 50 mcg by mouth daily before breakfast.  . losartan-hydrochlorothiazide (HYZAAR) 100-25 MG tablet Take 1 tablet by mouth daily.  . pravastatin (PRAVACHOL) 80 MG tablet Take 80 mg by mouth daily.   No current facility-administered medications for this visit.  (Other)      REVIEW OF SYSTEMS: ROS    Positive for: Eyes   Negative for: Constitutional, Gastrointestinal, Neurological, Skin, Genitourinary, Musculoskeletal, HENT, Endocrine, Cardiovascular, Respiratory, Psychiatric, Allergic/Imm, Heme/Lymph   Last edited by KZenovia Jordan LPN on 44/03/7352 12:99PM. (History)       ALLERGIES Allergies  Allergen Reactions  . Ace Inhibitors   . Valium [Diazepam]     PAST MEDICAL HISTORY Past Medical History:  Diagnosis Date  . Anxiety   . Cancer ([place]    breast  . Depression   . Edema    ANKLES  . Hypertension   . Hypothyroidism   . Phlebitis    H/O RIGHT ARM   Past Surgical History:  Procedure Laterality Date  . ABDOMINAL HYSTERECTOMY    . CATARACT EXTRACTION W/PHACO Left 04/04/2017   Procedure: CATARACT EXTRACTION PHACO AND INTRAOCULAR LENS PLACEMENT (IOC);  Surgeon: DEstill Cotta MD;  Location: [facility] ORS;  Service: Ophthalmology;  Laterality: Left;  [place] AP% 26.5 CDE 55.43 Fluid pack lot # [id number] . MASTECTOMY  BIL  . THYROIDECTOMY      FAMILY HISTORY Family History  Problem Relation Age of Onset  . Cancer Sister     SOCIAL HISTORY Social History   Tobacco Use  . Smoking status: Never Smoker  . Smokeless tobacco: Never Used  Substance Use Topics  . Alcohol use: No  . Drug use: No         OPHTHALMIC EXAM:  Base Eye Exam    Visual Acuity (Snellen - Linear)      Right Left   Dist cc 20/20 -2 20/25 -2   Dist ph cc NI 20/25 +2   Correction:  Glasses       Tonometry (Tonopen, 1:25 PM)      Right Left   Pressure 12 13       Pupils      Dark Light Shape React APD   Right 3 2  Round Sluggish None   Left 3 2 Round Sluggish Trace       Visual Fields (Counting fingers)      Left Right    Full Full       Extraocular Movement      Right Left    Full, Ortho Full, Ortho       Neuro/Psych    Oriented x3:  Yes   Mood/Affect:  Normal       Dilation    Both eyes:  1.0% Mydriacyl, 2.5% Phenylephrine @ 1:25 PM        Slit Lamp and Fundus Exam    Slit Lamp Exam      Right Left   Lids/Lashes Dermatochalasis - upper lid Dermatochalasis - upper lid   Conjunctiva/Sclera Melanosis Melanosis, no injection   Cornea Arcus Arcus, trace endo pigment   Anterior Chamber Deep and quiet, no cell or flare 1+ Cell/pigment, deep   Iris Round and dilated Round and dilated   Lens 2-3+ Nuclear sclerosis, 1+ Cortical cataract PC IOL in good position, anterior capsule fimosis, trace PCO   Vitreous Vitreous syneresis +pigmnet, Vitreous syneresis, vitreous condensation nasally       Fundus Exam      Right Left   Disc mild cupping, good rim pink and sharp   C/D Ratio 0.65 0.55   Macula Blunted foveal reflex, mild Retinal pigment epithelial mottling, No heme or edema Good foveal reflex, mild Retinal pigment epithelial mottling, No heme or edema   Vessels Normal Normal   Periphery Attached, scattered peripheral drusen Attached, scattered drusen          IMAGING AND PROCEDURES  Imaging and Procedures for 11/15/17  OCT, Retina - OU - Both Eyes       Right Eye Quality was good. Central Foveal Thickness: 251. Progression has been stable. Findings include normal foveal contour, no IRF, no SRF.   Left Eye Quality was good. Central Foveal Thickness: 273. Progression has been stable. Findings include normal foveal contour, no IRF, no SRF (Trace punctate hyper-reflectivies in vitreous, trace punctate cystic changes).   Notes *Images captured and stored on drive  Diagnosis / Impression:  NFP, No IRF/SRF OU  Clinical management:  See below  Abbreviations: NFP - Normal  foveal profile. CME - cystoid macular edema. PED - pigment epithelial detachment. IRF - intraretinal fluid. SRF - subretinal fluid. EZ - ellipsoid zone. ERM - epiretinal membrane. ORA - outer retinal atrophy. ORT - outer retinal tubulation. 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 Retinal edema H35.81 OCT, Retina - OU - Both Eyes  4. Pseudophakia, left eye Z96.1   5. 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 Anterior Uveitis OS- iridocyclitis OS -- improving - likely post-op inflammatory flare up  - s/p Kenalog injection (0.8 ml) by Dr. Zigmund Daniel on 02.14.19 - was on PF QID OS, but self tapered to BID prior to this flare up - developed acute pain, redness and light sensitivity yesterday -- improved with increasing PF back to QID - today, redness improved but still with 1+ AC and ant vitreous cell and pigment -- mostly pigment - cont PF OS Q2hr while awake for now - F/U 10 days -- if not improved significantly will consider further work up - ROS negative for inflammatory disease, autoimmune disease, arthritis, low back pain  3. No retinal edema on exam or OCT  4. Pseudophakia OS  - s/p CE/IOL OS  - post op inflammatory flare up as above  5. Combined form age-related cataract OD-  - The symptoms of cataract, surgical options, and treatments and risks were discussed with patient. - discussed diagnosis and progression - not yet visually significant - monitor    Ophthalmic Meds Ordered this visit:  No orders of the defined types were placed in this encounter.      Return in about 10 days (around 11/24/2017) for F/U ant. uveitis OS, Dilated exam, OCT.  There are no Patient Instructions on file for this visit.   Explained the diagnoses, plan, and follow up with the patient and they expressed understanding.  Patient expressed  understanding of the importance of proper follow up care.   This document serves as a record of services personally performed by Gardiner Sleeper, MD, PhD. It was created on their behalf by Catha Brow, [place], a certified ophthalmic assistant. The creation of this record is the provider's dictation and/or activities during the visit.  Electronically signed by: Catha Brow, [place]  11/15/17 12:18 PM   Gardiner Sleeper, M.D., Ph.D. Diseases & Surgery of the Retina and [place] 11/15/17  I have reviewed the above documentation for accuracy and completeness, and I agree with the above. Gardiner Sleeper, M.D., Ph.D. 11/15/17 12:18 PM    Abbreviations: M myopia (nearsighted); A astigmatism; H hyperopia (farsighted); P presbyopia; Mrx spectacle prescription;  CTL contact lenses; OD right eye; OS left eye; OU both eyes  XT exotropia; ET esotropia; PEK punctate epithelial keratitis; PEE punctate epithelial erosions; DES dry eye syndrome; MGD meibomian gland dysfunction; ATs artificial tears; PFAT's preservative free artificial tears; [place] nuclear sclerotic cataract; PSC posterior subcapsular cataract; ERM epi-retinal membrane; PVD posterior vitreous detachment; RD retinal detachment; DM diabetes mellitus; DR diabetic retinopathy; NPDR non-proliferative diabetic retinopathy; PDR proliferative diabetic retinopathy; CSME clinically significant macular edema; DME diabetic macular edema; dbh dot blot hemorrhages; CWS cotton wool spot; POAG primary open angle glaucoma; C/D cup-to-disc ratio; HVF humphrey visual field; GVF goldmann visual field; OCT optical coherence tomography; IOP intraocular pressure; BRVO Branch retinal vein occlusion; CRVO central retinal vein occlusion; CRAO central retinal artery occlusion; BRAO branch retinal artery occlusion; RT retinal tear; SB scleral buckle; PPV pars plana vitrectomy; VH Vitreous hemorrhage; PRP panretinal laser photocoagulation; IVK  intravitreal kenalog; VMT vitreomacular traction; MH Macular hole;  NVD neovascularization of the disc; NVE neovascularization elsewhere; AREDS age  related eye disease study; ARMD age related macular degeneration; POAG primary open angle glaucoma; EBMD epithelial/anterior basement membrane dystrophy; ACIOL anterior chamber intraocular lens; IOL intraocular lens; PCIOL posterior chamber intraocular lens; Phaco/IOL phacoemulsification with intraocular lens placement; [place] photorefractive keratectomy; LASIK laser assisted in situ keratomileusis; HTN hypertension; DM diabetes mellitus; COPD chronic obstructive pulmonary disease

## 2017-11-14 ENCOUNTER — Ambulatory Visit (INDEPENDENT_AMBULATORY_CARE_PROVIDER_SITE_OTHER): Payer: Medicare Other | Admitting: Ophthalmology

## 2017-11-14 ENCOUNTER — Encounter (INDEPENDENT_AMBULATORY_CARE_PROVIDER_SITE_OTHER): Payer: Self-pay | Admitting: Ophthalmology

## 2017-11-14 DIAGNOSIS — H3581 Retinal edema: Secondary | ICD-10-CM | POA: Diagnosis not present

## 2017-11-14 DIAGNOSIS — Z961 Presence of intraocular lens: Secondary | ICD-10-CM | POA: Diagnosis not present

## 2017-11-14 DIAGNOSIS — H209 Unspecified iridocyclitis: Secondary | ICD-10-CM

## 2017-11-14 DIAGNOSIS — H25813 Combined forms of age-related cataract, bilateral: Secondary | ICD-10-CM | POA: Diagnosis not present

## 2017-11-15 ENCOUNTER — Encounter (INDEPENDENT_AMBULATORY_CARE_PROVIDER_SITE_OTHER): Payer: Self-pay | Admitting: Ophthalmology
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
## 2017-12-03 ENCOUNTER — Encounter (INDEPENDENT_AMBULATORY_CARE_PROVIDER_SITE_OTHER): Payer: Self-pay | Admitting: Ophthalmology

## 2017-12-03 ENCOUNTER — Ambulatory Visit (INDEPENDENT_AMBULATORY_CARE_PROVIDER_SITE_OTHER): Payer: Medicare Other | Admitting: Ophthalmology

## 2017-12-03 DIAGNOSIS — H3581 Retinal edema: Secondary | ICD-10-CM | POA: Diagnosis not present

## 2017-12-03 DIAGNOSIS — H25813 Combined forms of age-related cataract, bilateral: Secondary | ICD-10-CM

## 2017-12-03 DIAGNOSIS — H209 Unspecified iridocyclitis: Secondary | ICD-10-CM | POA: Diagnosis not present

## 2017-12-03 DIAGNOSIS — Z961 Presence of intraocular lens: Secondary | ICD-10-CM

## 2017-12-26 ENCOUNTER — Encounter (INDEPENDENT_AMBULATORY_CARE_PROVIDER_SITE_OTHER): Payer: Medicare Other | Admitting: Ophthalmology

## 2018-01-01 NOTE — Progress Notes (Signed)
[facility] Note  01/02/2018     CHIEF COMPLAINT Patient presents for Retina Follow Up   HISTORY OF PRESENT ILLNESS: Brianna Keller is a 77 y.o. female who presents to the clinic today for:   HPI    Retina Follow Up    Patient presents with  Other.  In left eye.  This started 1 month ago.  Severity is mild.  Since onset it is stable.  I, the attending physician,  performed the HPI with the patient and updated documentation appropriately.          Comments    F/U ant. Uvetis. Patient states she has puffiness under her OS, "feels as something is in my eye after using eye drops". Pt reports she needing to not use computer as much due strain OS. Vision is about same, not   experiencing severe pain like she once was, floaters are mostly noted after using Gtt's. Pt using PF Q2hrs while at home.       Last edited by Bernarda Caffey, MD on 01/02/2018  2:10 PM. (History)      Referring physician: [facility] [place], [place] [address]  HISTORICAL INFORMATION:   Selected notes from the MEDICAL RECORD NUMBER Referred by Dr. Zigmund Daniel for concern of uveitis;  LEE- 02.14.19 (JDM) [BCVA OD: 20/25 OS: 20/25+2] Ocular Hx- HTN ret OU, cataract OD, pseudophakia OS ([place]), iritis OD, S/P kenalog inj OS (0.62m), on PF QID OS, FA (02.1.18) with late mild CME OS and geographic CME; PMH-     CURRENT MEDICATIONS: Current Outpatient Medications (Ophthalmic Drugs)  Medication Sig  . prednisoLONE acetate (PRED FORTE) 1 % ophthalmic suspension Place 1 drop into the left eye every 2 (two) hours.   No current facility-administered medications for this visit.  (Ophthalmic Drugs)   Current Outpatient Medications (Other)  Medication Sig  . amLODipine (NORVASC) 10 MG tablet Take 10 mg by mouth daily.  .Marland Kitchenaspirin 81 MG chewable tablet Chew 81 mg by mouth daily.  . cholecalciferol (VITAMIN D) 1000 units tablet Take 1,000 Units by mouth  daily.  .Marland Kitchenlevothyroxine (SYNTHROID, LEVOTHROID) 50 MCG tablet Take 50 mcg by mouth daily before breakfast.  . losartan-hydrochlorothiazide (HYZAAR) 100-25 MG tablet Take 1 tablet by mouth daily.  . pravastatin (PRAVACHOL) 80 MG tablet Take 80 mg by mouth daily.   No current facility-administered medications for this visit.  (Other)      REVIEW OF SYSTEMS: ROS    Positive for: Eyes   Negative for: Constitutional, Gastrointestinal, Neurological, Skin, Genitourinary, Musculoskeletal, HENT, Endocrine, Cardiovascular, Respiratory, Psychiatric, Allergic/Imm, Heme/Lymph   Last edited by KZenovia Jordan LPN on 65/02/5276 18:24PM. (History)       ALLERGIES Allergies  Allergen Reactions  . Ace Inhibitors   . Valium [Diazepam]     PAST MEDICAL HISTORY Past Medical History:  Diagnosis Date  . Anxiety   . Cancer ([place]    breast  . Depression   . Edema    ANKLES  . Hypertension   . Hypothyroidism   . Phlebitis    H/O RIGHT ARM   Past Surgical History:  Procedure Laterality Date  . ABDOMINAL HYSTERECTOMY    . CATARACT EXTRACTION W/PHACO Left 04/04/2017   Procedure: CATARACT EXTRACTION PHACO AND INTRAOCULAR LENS PLACEMENT (IOC);  Surgeon: DEstill Cotta MD;  Location: [facility] ORS;  Service: Ophthalmology;  Laterality: Left;  [place]:16 AP% 26.5 CDE 55.43 Fluid pack lot # [id number] .  MASTECTOMY     BIL  . THYROIDECTOMY      FAMILY HISTORY Family History  Problem Relation Age of Onset  . Cancer Sister     SOCIAL HISTORY Social History   Tobacco Use  . Smoking status: Never Smoker  . Smokeless tobacco: Never Used  Substance Use Topics  . Alcohol use: No  . Drug use: No         OPHTHALMIC EXAM:  Base Eye Exam    Visual Acuity (Snellen - Linear)      Right Left   Dist [place] 20/25 20/30   Dist ph [place] NI NI       Tonometry (Tonopen, 1:28 PM)      Right Left   Pressure 16 18       Pupils      Dark Light Shape React APD   Right 4 3 Round Minimal None   Left  4 3 Round Minimal None       Visual Fields      Left Right    Full Full       Extraocular Movement      Right Left    Full, Ortho Full, Ortho       Neuro/Psych    Oriented x3:  Yes   Mood/Affect:  Normal       Dilation    Both eyes:  1.0% Mydriacyl, P[place]d @ 1:29 PM        Slit Lamp and Fundus Exam    External Exam      Right Left   External mild periorbital edema non tender to touch, erythema        Slit Lamp Exam      Right Left   Lids/Lashes Dermatochalasis - upper lid Dermatochalasis - upper lid   Conjunctiva/Sclera Melanosis Melanosis, no injection   Cornea Arcus Arcus, trace endo pigment   Anterior Chamber Deep and quiet 0.5+ Cell/pigment, deep   Iris Round and dilated Round and dilated   Lens 2-3+ Nuclear sclerosis, 1+ Cortical cataract PC IOL in good position, anterior capsule fimosis, trace PCO   Vitreous Vitreous syneresis +pigment, Vitreous syneresis, vitreous condensation nasally       Fundus Exam      Right Left   Disc mild cupping, good rim pink and sharp   C/D Ratio 0.65 0.55   Macula mild Retinal pigment epithelial mottling, Good foveal reflex, No heme or edema Good foveal reflex, mild Retinal pigment epithelial mottling, No heme or edema   Vessels Mild Copper wiring Normal   Periphery Attached, scattered peripheral drusen Attached, scattered drusen          IMAGING AND PROCEDURES  Imaging and Procedures for 11/15/17  OCT, Retina - OU - Both Eyes       Right Eye Quality was good. Central Foveal Thickness: 253. Progression has been stable. Findings include normal foveal contour, no IRF, no SRF.   Left Eye Quality was good. Central Foveal Thickness: 278. Progression has been stable. Findings include normal foveal contour, no IRF, no SRF (Trace punctate hyper-reflectivies in vitreous).   Notes *Images captured and stored on drive  Diagnosis / Impression:  NFP, No IRF/SRF OU  Clinical management:  See below  Abbreviations: NFP -  Normal foveal profile. CME - cystoid macular edema. PED - pigment epithelial detachment. IRF - intraretinal fluid. SRF - subretinal fluid. EZ - ellipsoid zone. ERM - epiretinal membrane. ORA - outer retinal atrophy. ORT - outer retinal tubulation. 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 Retinal edema H35.81 OCT, Retina - OU - Both Eyes  4. Pseudophakia, left eye Z96.1   5. Combined forms of age-related cataract, right eye H25.811     1,2. Anterior Uveitis OS- iridocyclitis OS -- improving - likely post-op inflammatory flare up  - s/p Kenalog injection (0.8 ml) by Dr. Zigmund Daniel on 02.14.19 - was on PF QID OS, but self tapered to BID prior to initial flare up - today, inflammation and symptoms improved - decrease PF to 6x daily OU for now - F/U 3-4 weeks -- if not improved significantly will consider further work up - ROS negative for inflammatory disease, autoimmune disease, arthritis, low back pain  3. No retinal edema on exam or OCT  4. Pseudophakia OS  - s/p CE/IOL OS  - post op inflammatory flare up as above  5. Combined form age-related cataract OD-  - The symptoms of cataract, surgical options, and treatments and risks were discussed with patient. - discussed diagnosis and progression - not yet visually significant - monitor    Ophthalmic Meds Ordered this visit:  No orders of the defined types were placed in this encounter.      Return for 3-4 wks - ant uveitis f/u.  There are no Patient Instructions on file for this visit.   Explained the diagnoses, plan, and follow up with the patient and they expressed understanding.  Patient expressed understanding of the importance of proper follow up care.   This document serves as a record of services personally performed by Gardiner Sleeper, MD, PhD. It was created on their behalf by Catha Brow, [place], a certified ophthalmic assistant. The creation of this record is the provider's dictation and/or activities during the visit.  Electronically signed by: Catha Brow, COA  06.04.19 11:15 PM   Gardiner Sleeper, M.D., Ph.D. Diseases & Surgery of the Retina and Vitreous [facility]  I have reviewed the above documentation for accuracy and completeness, and I agree with the above. Gardiner Sleeper, M.D., Ph.D. 01/02/18 11:15 PM   Abbreviations: M myopia (nearsighted); A astigmatism; H hyperopia (farsighted); P presbyopia; Mrx spectacle prescription;  CTL contact lenses; OD right eye; OS left eye; OU both eyes  XT exotropia; ET esotropia; PEK punctate epithelial keratitis; PEE punctate epithelial erosions; DES dry eye syndrome; MGD meibomian gland dysfunction; ATs artificial tears; PFAT's preservative free artificial tears; [place] nuclear sclerotic cataract; PSC posterior subcapsular cataract; ERM epi-retinal membrane; PVD posterior vitreous detachment; RD retinal detachment; DM diabetes mellitus; DR diabetic retinopathy; NPDR non-proliferative diabetic retinopathy; PDR proliferative diabetic retinopathy; CSME clinically significant macular edema; DME diabetic macular edema; dbh dot blot hemorrhages; CWS cotton wool spot; POAG primary open angle glaucoma; C/D cup-to-disc ratio; HVF humphrey visual field; GVF goldmann visual field; OCT optical coherence tomography; IOP intraocular pressure; BRVO Branch retinal vein occlusion; CRVO central retinal vein occlusion; CRAO central retinal artery occlusion; BRAO branch retinal artery occlusion; RT retinal tear; SB scleral buckle; PPV pars plana vitrectomy; VH Vitreous hemorrhage; PRP panretinal laser photocoagulation; IVK intravitreal kenalog; VMT vitreomacular traction; MH Macular hole;  NVD neovascularization of the disc; NVE neovascularization elsewhere; AREDS age related eye disease study; ARMD age related macular degeneration;  POAG primary open angle glaucoma; EBMD epithelial/anterior basement membrane dystrophy;  ACIOL anterior chamber intraocular lens; IOL intraocular lens; PCIOL posterior chamber intraocular lens; Phaco/IOL phacoemulsification with intraocular lens placement; [place] photorefractive keratectomy; LASIK laser assisted in situ keratomileusis; HTN hypertension; DM diabetes mellitus; COPD chronic obstructive pulmonary disease

## 2018-01-02 ENCOUNTER — Ambulatory Visit (INDEPENDENT_AMBULATORY_CARE_PROVIDER_SITE_OTHER): Payer: Medicare Other | Admitting: Ophthalmology

## 2018-01-02 ENCOUNTER — Encounter (INDEPENDENT_AMBULATORY_CARE_PROVIDER_SITE_OTHER): Payer: Self-pay | Admitting: Ophthalmology

## 2018-01-02 DIAGNOSIS — H25811 Combined forms of age-related cataract, right eye: Secondary | ICD-10-CM | POA: Diagnosis not present

## 2018-01-02 DIAGNOSIS — Z961 Presence of intraocular lens: Secondary | ICD-10-CM | POA: Diagnosis not present

## 2018-01-02 DIAGNOSIS — H209 Unspecified iridocyclitis: Secondary | ICD-10-CM | POA: Diagnosis not present

## 2018-01-02 DIAGNOSIS — H3581 Retinal edema: Secondary | ICD-10-CM | POA: Diagnosis not present

## 2018-02-06 ENCOUNTER — Encounter (INDEPENDENT_AMBULATORY_CARE_PROVIDER_SITE_OTHER): Payer: Medicare Other | Admitting: Ophthalmology

## 2018-12-22 IMAGING — US US RENAL
1 series · 13 of 25 positions shown · non-contrast
Comparison: None.

CLINICAL DATA: Stage 3 chronic kidney disease.

EXAM:
RENAL / URINARY TRACT ULTRASOUND COMPLETE

[Series 1: us renal · 0.18mm/px · 13 of 44 slices shown]
[im 1/44]
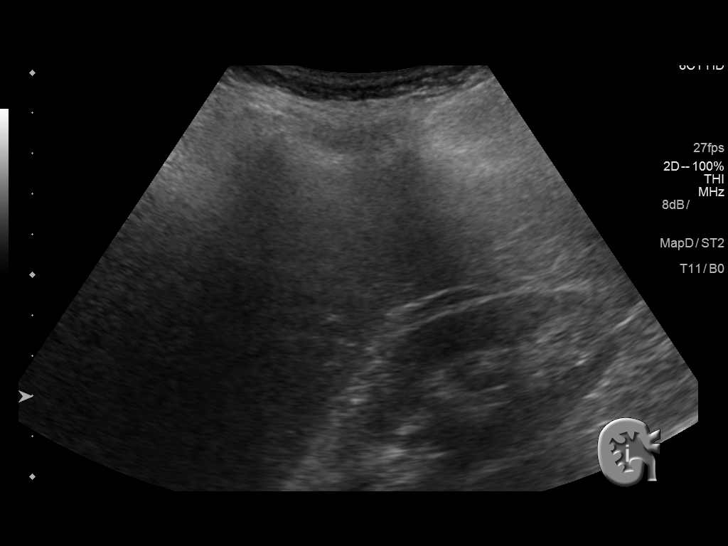
[im 4/44]
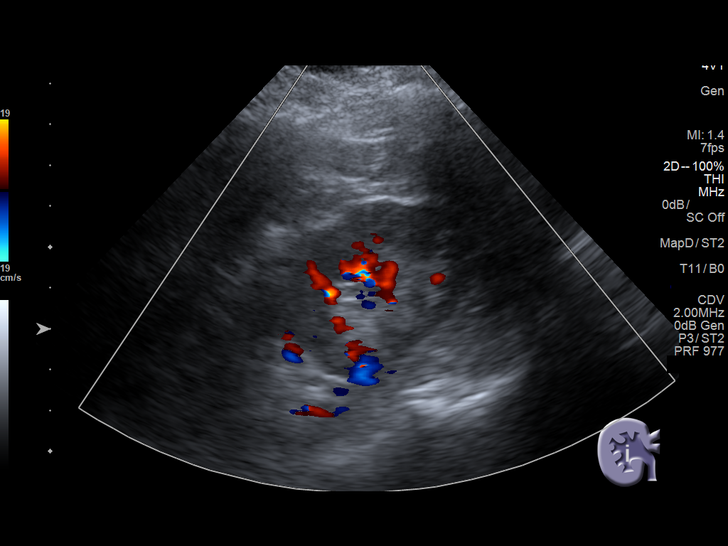
[im 8/44]
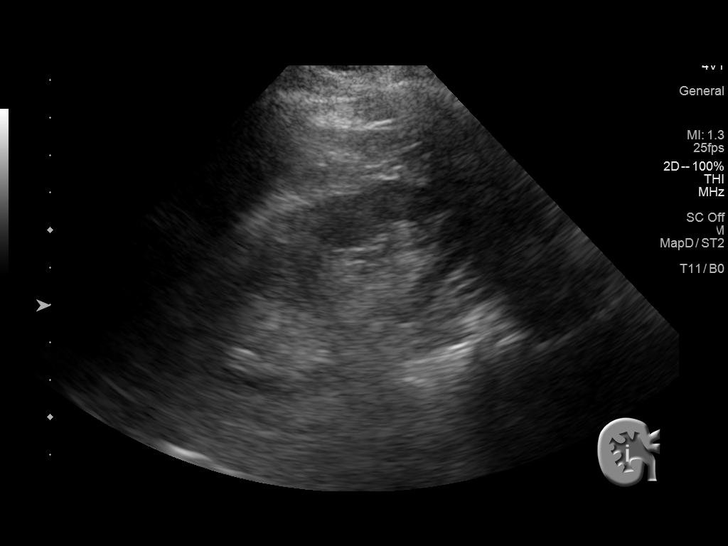
[im 11/44]
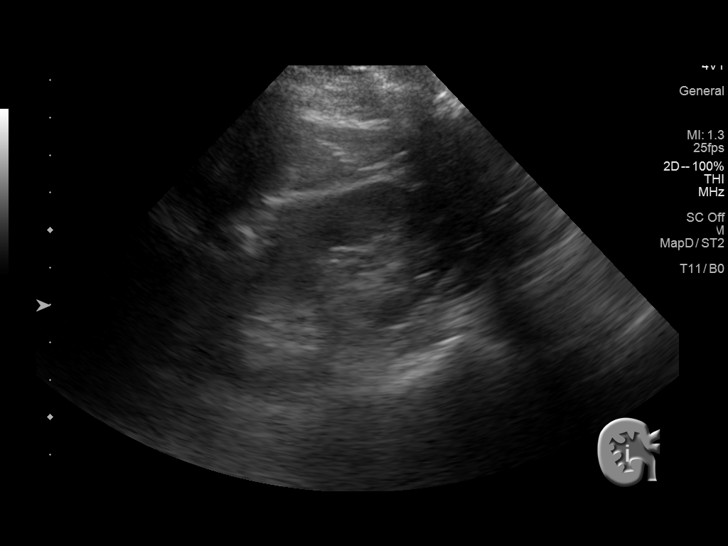
[im 15/44]
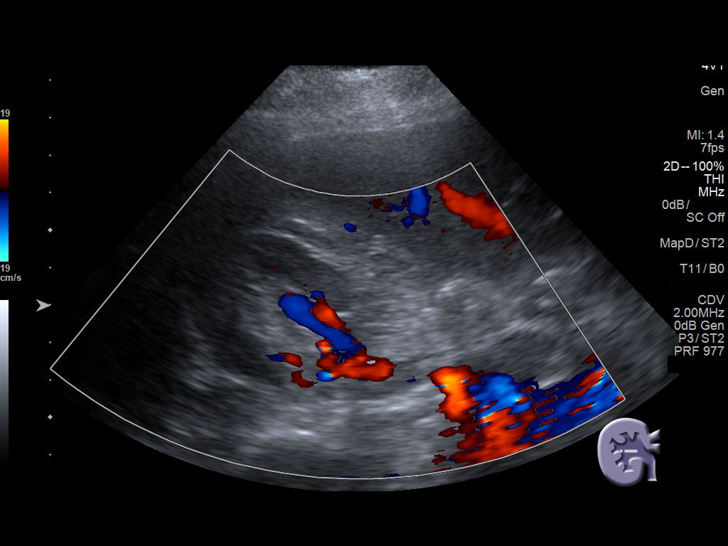
[im 18/44]
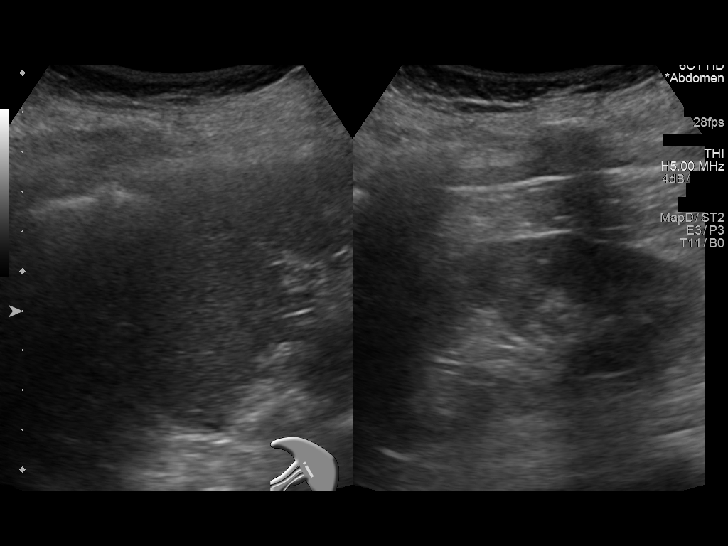
[im 22/44]
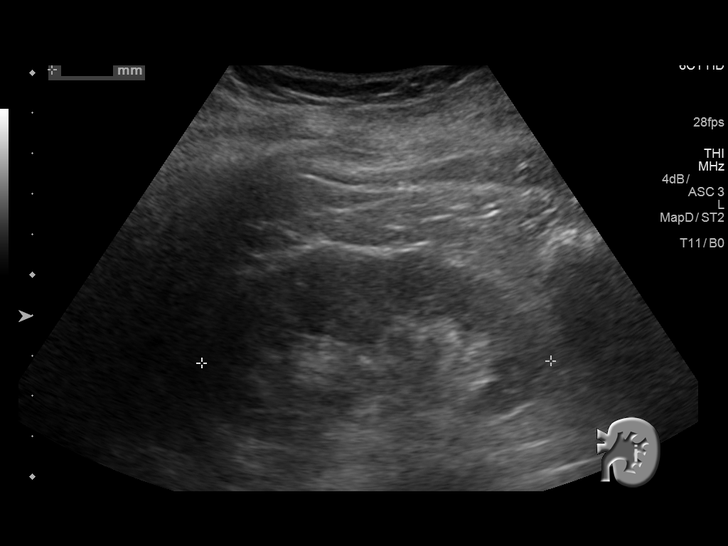
[im 26/44]
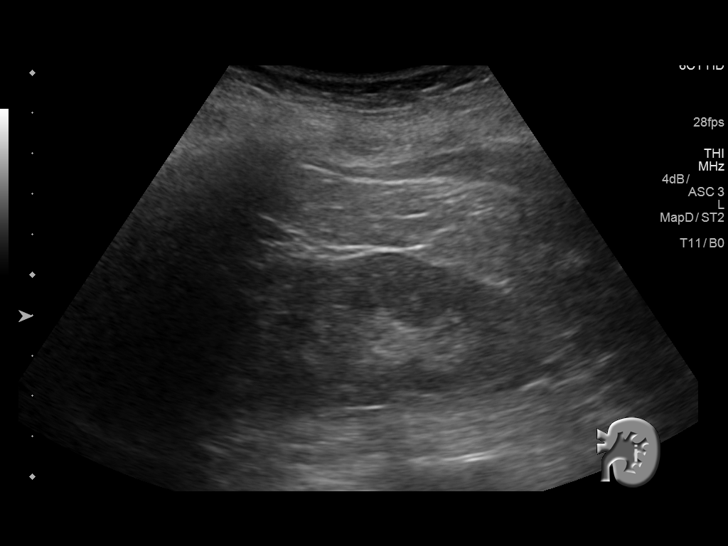
[im 29/44]
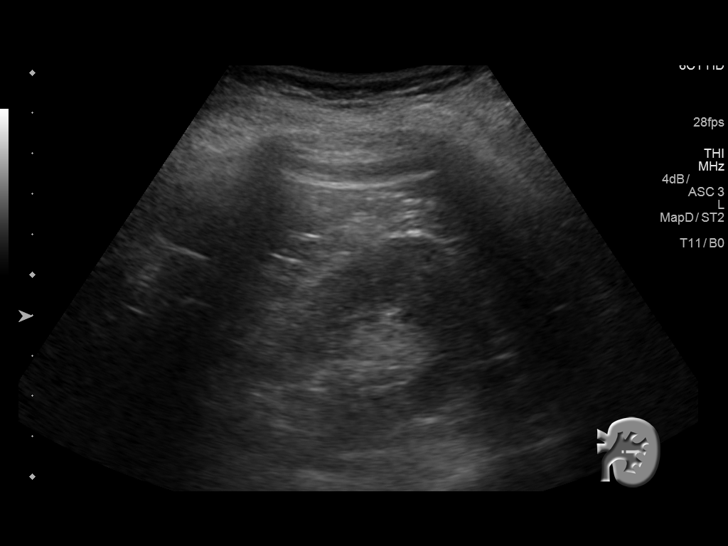
[im 33/44]
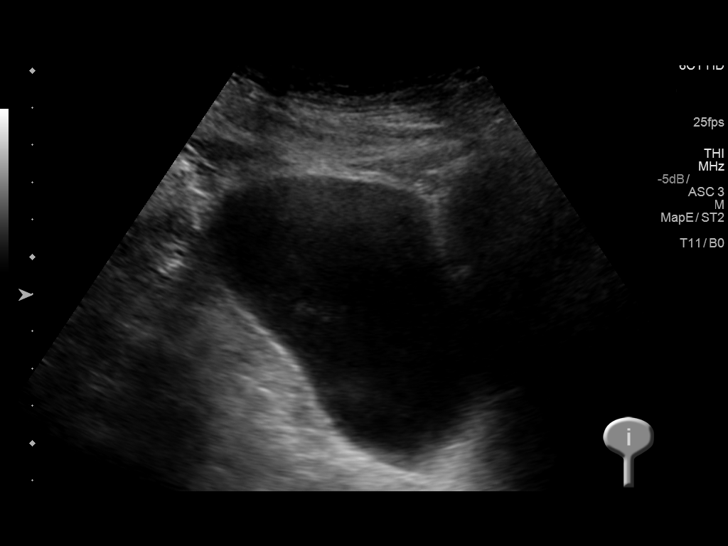
[im 36/44]
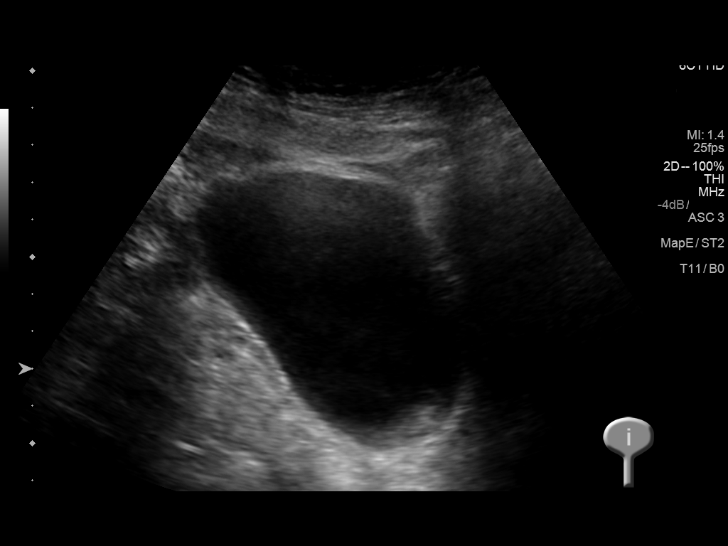
[im 40/44]
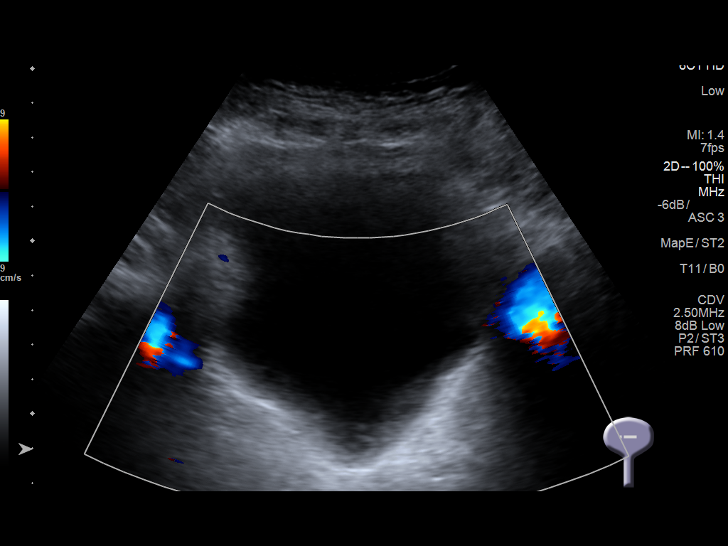
[im 44/44]
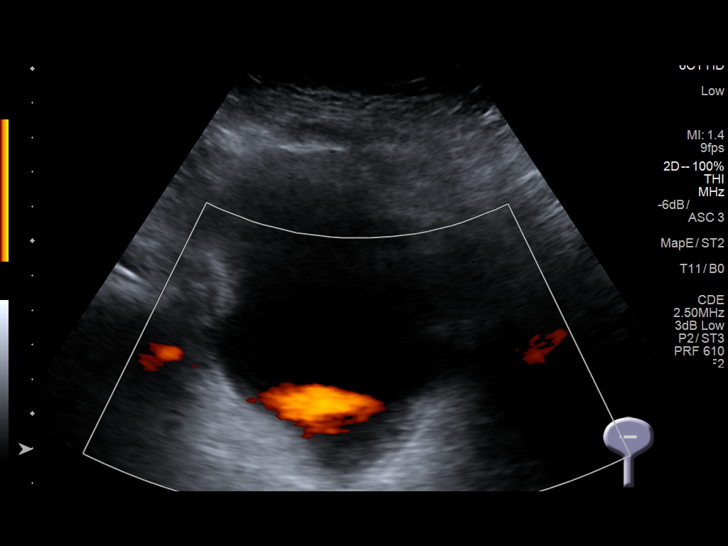

[13 of 25 positions shown; findings below may reference images not displayed]

FINDINGS: Right Kidney:

Length: 8.8 cm. Slightly small right kidney. Right renal parenchymal
thickness is within normal limits. Mildly echogenic right renal
parenchyma. No right hydronephrosis. No right renal mass
demonstrated.

Left Kidney:

Length: 9.4 cm. Slightly small left kidney. Left renal parenchymal
thickness is within normal limits. Mildly echogenic left renal
parenchyma. No left hydronephrosis. No left renal mass demonstrated.

Bladder:

Bilateral ureteral jets are demonstrated in the bladder. No definite
bladder wall thickening. There is nonspecific echogenic material in
the dependent bladder measuring up to 9 mm thickness without
vascularity on color Doppler.
IMPRESSION: 1. No hydronephrosis.
2. Symmetrically small echogenic kidneys, consistent with the
provided history of chronic kidney disease.
3. Nonspecific echogenic material in the dependent bladder, which
could represent layering debris, with a small bladder mass not
excluded. Recommend correlation with urinalysis. Hematuria protocol
CT of the abdomen and pelvis without and with IV contrast and/or
cystoscopic correlation could be obtained as clinically warranted.

## 2019-01-07 ENCOUNTER — Other Ambulatory Visit (HOSPITAL_COMMUNITY): Payer: Self-pay | Admitting: Nurse Practitioner

## 2019-01-07 ENCOUNTER — Other Ambulatory Visit: Payer: Self-pay | Admitting: Nurse Practitioner

## 2019-01-07 DIAGNOSIS — M79605 Pain in left leg: Secondary | ICD-10-CM

## 2019-01-13 ENCOUNTER — Telehealth (HOSPITAL_COMMUNITY): Payer: Self-pay

## 2019-01-16 ENCOUNTER — Ambulatory Visit (HOSPITAL_COMMUNITY): Payer: Medicare Other

## 2019-01-16 ENCOUNTER — Other Ambulatory Visit: Payer: Self-pay

## 2019-01-16 ENCOUNTER — Ambulatory Visit (HOSPITAL_COMMUNITY)
Admission: RE | Admit: 2019-01-16 | Discharge: 2019-01-16 | Disposition: A | Discharge status: Home / Self Care | Payer: Medicare Other | Source: Ambulatory Visit | Attending: Nurse Practitioner | Admitting: Nurse Practitioner

## 2019-01-16 DIAGNOSIS — M79605 Pain in left leg: Secondary | ICD-10-CM | POA: Insufficient documentation

## 2021-01-12 ENCOUNTER — Other Ambulatory Visit (HOSPITAL_COMMUNITY): Payer: Self-pay | Admitting: Nurse Practitioner

## 2021-01-12 DIAGNOSIS — I10 Essential (primary) hypertension: Secondary | ICD-10-CM

## 2021-02-07 ENCOUNTER — Ambulatory Visit (HOSPITAL_COMMUNITY): Payer: Medicare Other

## 2021-02-17 ENCOUNTER — Ambulatory Visit (HOSPITAL_COMMUNITY): Admission: RE | Admit: 2021-02-17 | Payer: Medicare Other | Source: Ambulatory Visit

## 2021-02-17 ENCOUNTER — Encounter (HOSPITAL_COMMUNITY): Payer: Self-pay

## 2021-02-21 ENCOUNTER — Ambulatory Visit (HOSPITAL_COMMUNITY)
Admission: RE | Admit: 2021-02-21 | Discharge: 2021-02-21 | Disposition: A | Discharge status: Home / Self Care | Payer: Medicare PPO | Source: Ambulatory Visit | Attending: Nurse Practitioner | Admitting: Nurse Practitioner

## 2021-02-21 ENCOUNTER — Other Ambulatory Visit: Payer: Self-pay

## 2021-02-21 DIAGNOSIS — I129 Hypertensive chronic kidney disease with stage 1 through stage 4 chronic kidney disease, or unspecified chronic kidney disease: Secondary | ICD-10-CM | POA: Insufficient documentation

## 2021-02-21 DIAGNOSIS — N183 Chronic kidney disease, stage 3 unspecified: Secondary | ICD-10-CM | POA: Diagnosis not present

## 2021-02-21 DIAGNOSIS — I10 Essential (primary) hypertension: Secondary | ICD-10-CM

## 2022-09-27 IMAGING — US US RENAL
1 series · 14 of 25 positions shown · non-contrast
Comparison: 07/27/2016

CLINICAL DATA: Benign hypertension

EXAM:
RENAL / URINARY TRACT ULTRASOUND COMPLETE

[Series 1: us renal · 0.23mm/px · 14 of 50 slices shown]
[im 1/50]
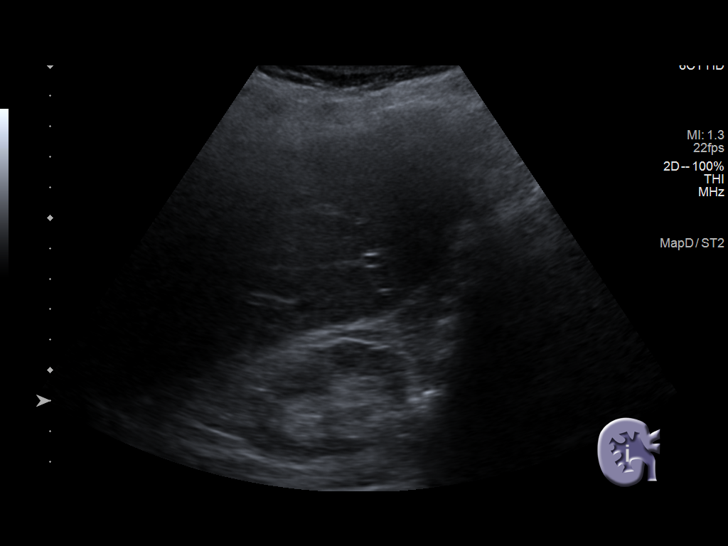
[im 5/50]
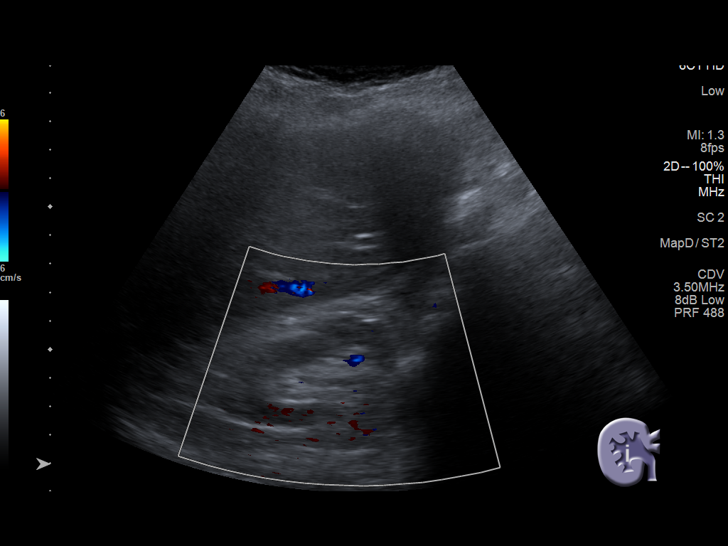
[im 9/50]
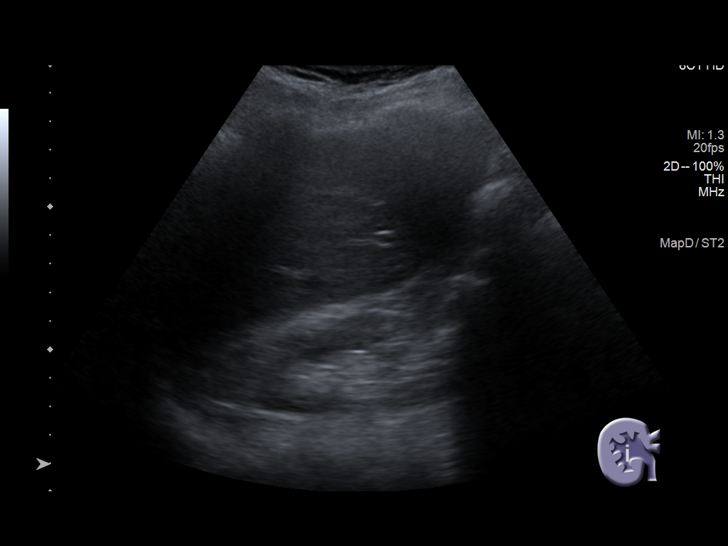
[im 13/50]
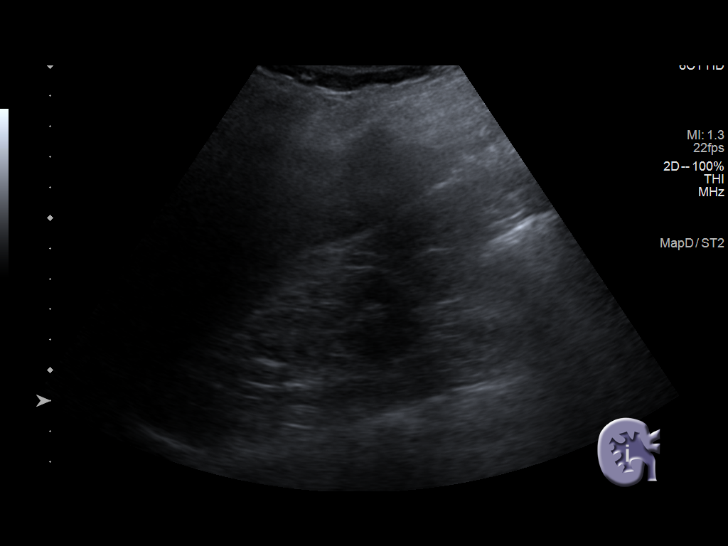
[im 17/50]
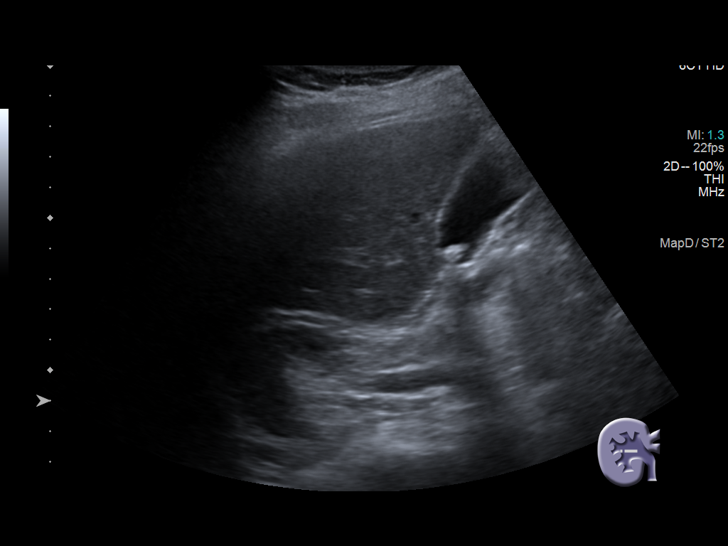
[im 19/50]
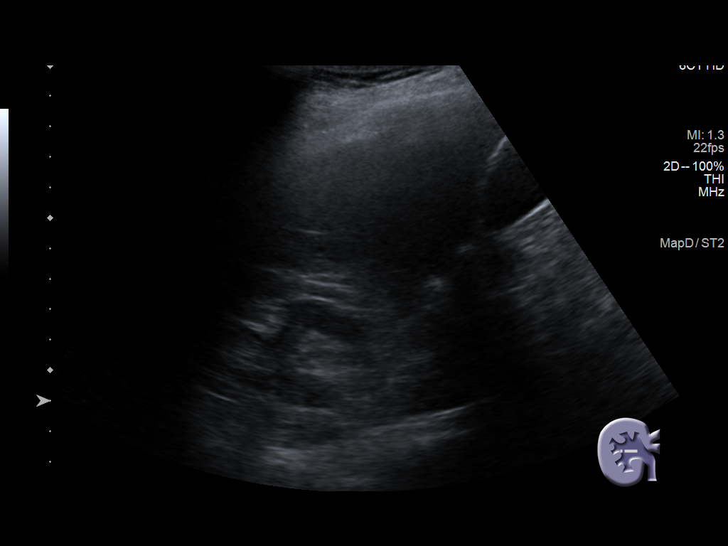
[im 23/50]
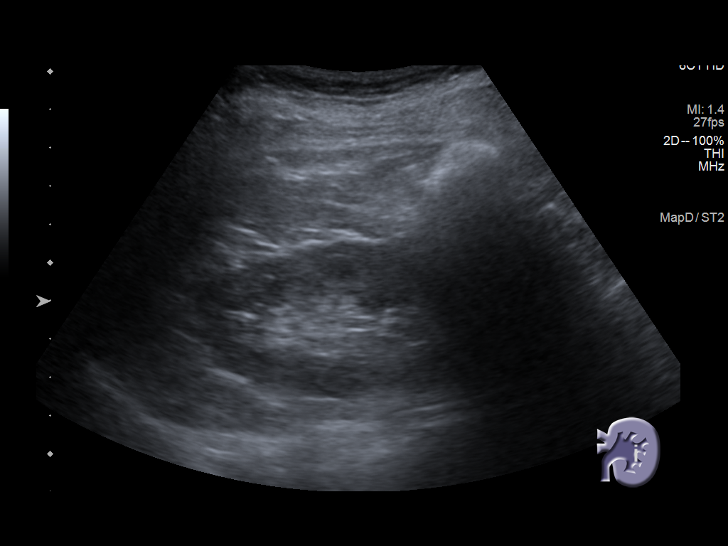
[im 27/50]
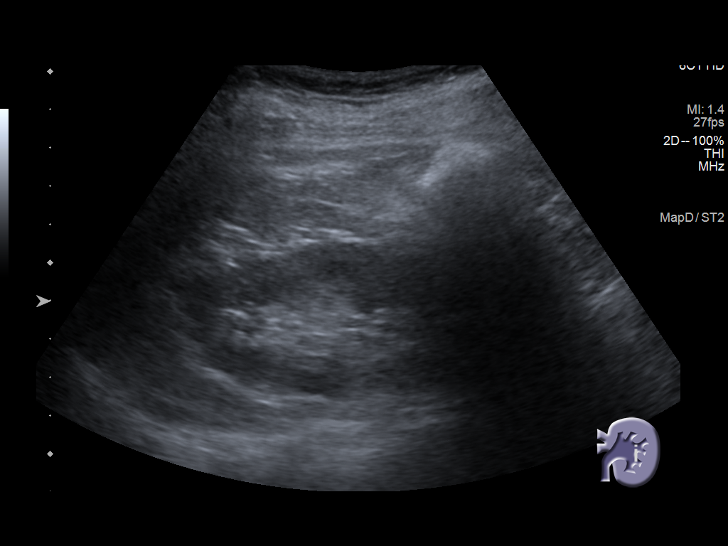
[im 31/50]
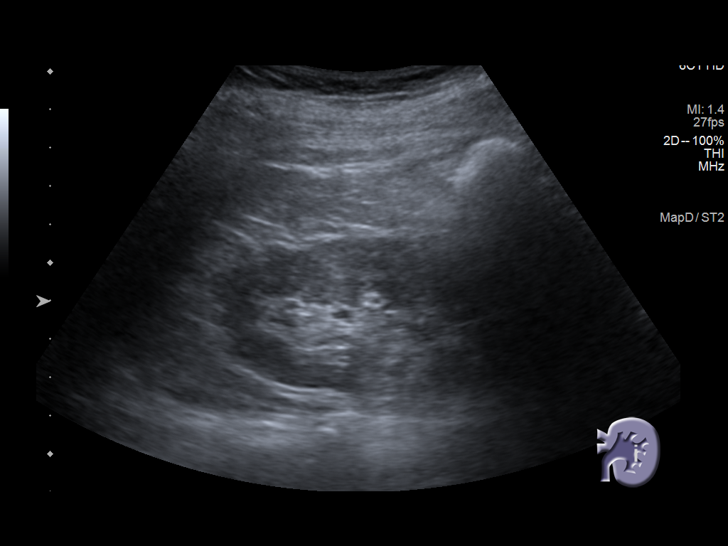
[im 33/50]
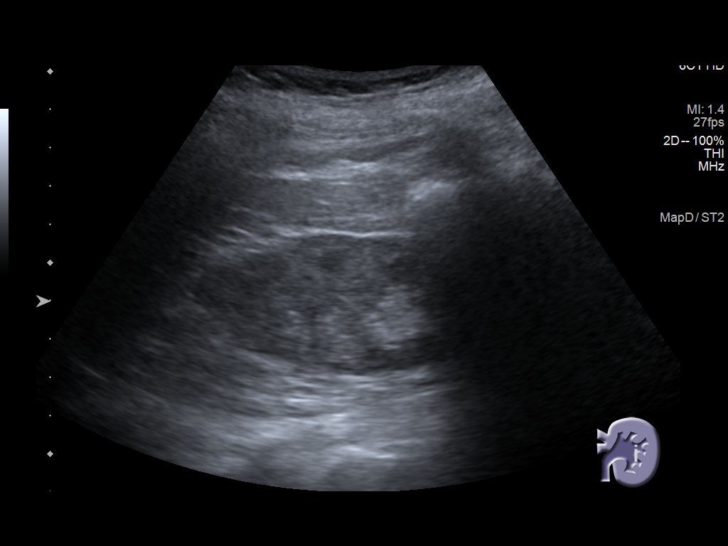
[im 37/50]
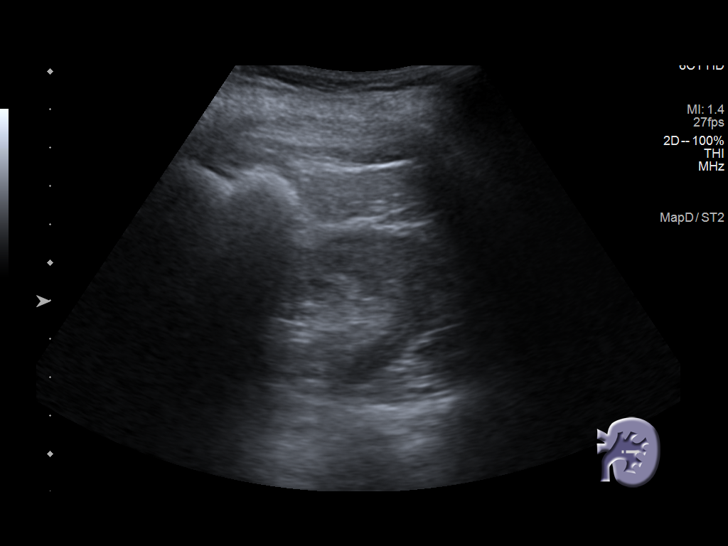
[im 41/50]
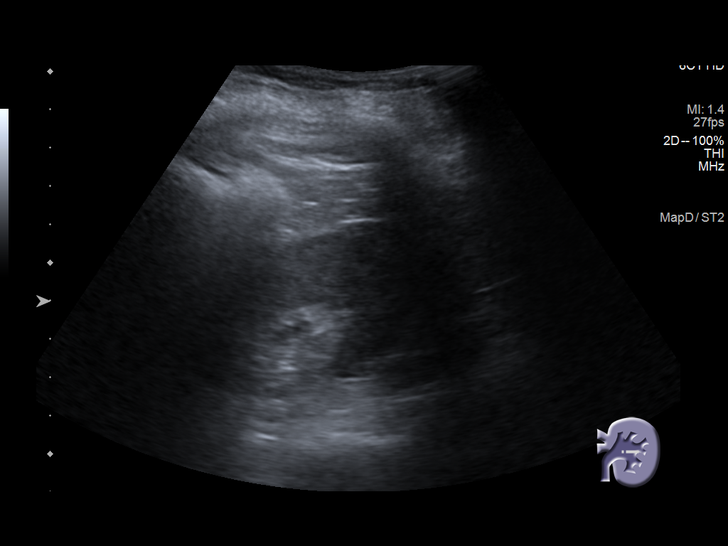
[im 45/50]
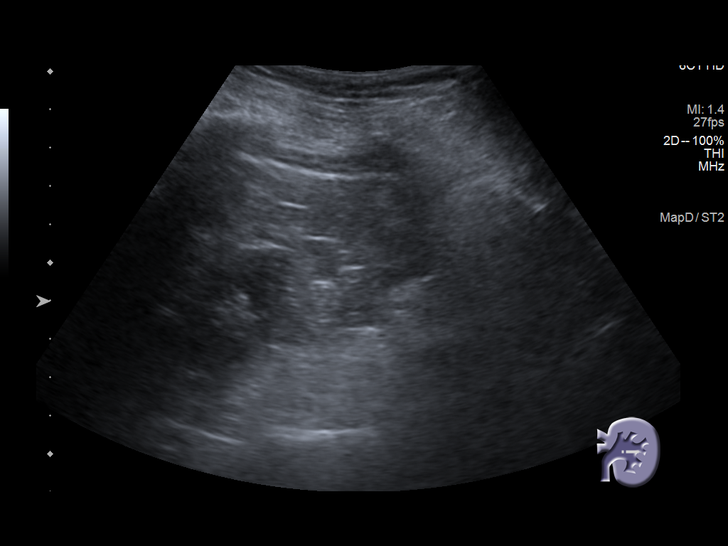
[im 50/50]
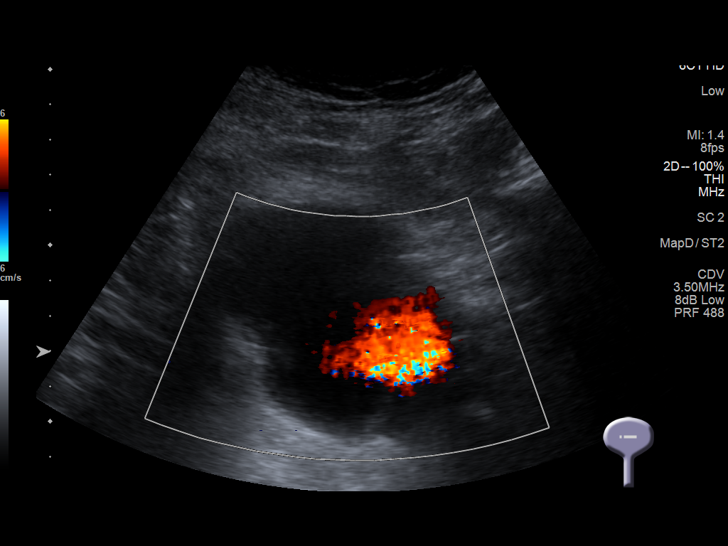

[14 of 25 positions shown; findings below may reference images not displayed]

FINDINGS: Right Kidney:

Renal measurements: 6.4 x 3.4 x 4.5 cm = volume: 50 mL. Echogenicity
within normal limits. No mass or hydronephrosis visualized.

Left Kidney:

Renal measurements: 8.7 x 4.2 x 4.8 cm = volume: 90 mL. Echogenicity
within normal limits. No mass or hydronephrosis visualized.

Bladder:

Appears normal for degree of bladder distention.

Other:

Incidental note made of cholelithiasis.
IMPRESSION: Asymmetrically smaller right kidney. Otherwise unremarkable renal
ultrasound.

## 2022-11-06 ENCOUNTER — Other Ambulatory Visit (HOSPITAL_COMMUNITY): Payer: Self-pay | Admitting: Nephrology

## 2022-11-06 DIAGNOSIS — N1832 Chronic kidney disease, stage 3b: Secondary | ICD-10-CM

## 2022-11-06 DIAGNOSIS — N270 Small kidney, unilateral: Secondary | ICD-10-CM

## 2022-11-06 DIAGNOSIS — I129 Hypertensive chronic kidney disease with stage 1 through stage 4 chronic kidney disease, or unspecified chronic kidney disease: Secondary | ICD-10-CM

## 2022-11-06 DIAGNOSIS — N170 Acute kidney failure with tubular necrosis: Secondary | ICD-10-CM

## 2022-11-15 ENCOUNTER — Ambulatory Visit (HOSPITAL_COMMUNITY)
Admission: RE | Admit: 2022-11-15 | Discharge: 2022-11-15 | Disposition: A | Discharge status: Home / Self Care | Payer: Medicare PPO | Source: Ambulatory Visit | Attending: Nephrology | Admitting: Nephrology

## 2022-11-15 DIAGNOSIS — I129 Hypertensive chronic kidney disease with stage 1 through stage 4 chronic kidney disease, or unspecified chronic kidney disease: Secondary | ICD-10-CM | POA: Diagnosis present

## 2022-11-15 DIAGNOSIS — N270 Small kidney, unilateral: Secondary | ICD-10-CM | POA: Insufficient documentation

## 2022-11-15 DIAGNOSIS — N170 Acute kidney failure with tubular necrosis: Secondary | ICD-10-CM | POA: Diagnosis present

## 2022-11-15 DIAGNOSIS — N1832 Chronic kidney disease, stage 3b: Secondary | ICD-10-CM | POA: Insufficient documentation
